# Patient Record
Sex: Female | Born: 1965 | Race: Black or African American | Hispanic: No | Marital: Single | Smoking: Never smoker
Health system: Southern US, Community
[De-identification: ages and names within clinical notes are randomized; demographics above are authoritative.]

## PROBLEM LIST (undated history)

## (undated) DIAGNOSIS — D219 Benign neoplasm of connective and other soft tissue, unspecified: Secondary | ICD-10-CM

## (undated) DIAGNOSIS — R002 Palpitations: Secondary | ICD-10-CM

## (undated) DIAGNOSIS — M549 Dorsalgia, unspecified: Secondary | ICD-10-CM

## (undated) DIAGNOSIS — S060XAA Concussion with loss of consciousness status unknown, initial encounter: Secondary | ICD-10-CM

## (undated) DIAGNOSIS — K921 Melena: Secondary | ICD-10-CM

## (undated) DIAGNOSIS — Z8619 Personal history of other infectious and parasitic diseases: Secondary | ICD-10-CM

## (undated) DIAGNOSIS — S060X9A Concussion with loss of consciousness of unspecified duration, initial encounter: Secondary | ICD-10-CM

## (undated) HISTORY — DX: Melena: K92.1

## (undated) HISTORY — DX: Dorsalgia, unspecified: M54.9

## (undated) HISTORY — DX: Personal history of other infectious and parasitic diseases: Z86.19

## (undated) HISTORY — DX: Palpitations: R00.2

## (undated) HISTORY — DX: Concussion with loss of consciousness of unspecified duration, initial encounter: S06.0X9A

## (undated) HISTORY — DX: Concussion with loss of consciousness status unknown, initial encounter: S06.0XAA

## (undated) HISTORY — DX: Benign neoplasm of connective and other soft tissue, unspecified: D21.9

---

## 2003-08-19 ENCOUNTER — Other Ambulatory Visit: Admission: RE | Admit: 2003-08-19 | Discharge: 2003-08-19 | Payer: Self-pay | Admitting: *Deleted

## 2007-02-02 ENCOUNTER — Emergency Department: Payer: Self-pay | Admitting: Emergency Medicine

## 2007-04-21 ENCOUNTER — Ambulatory Visit: Payer: Self-pay | Admitting: Internal Medicine

## 2008-02-04 ENCOUNTER — Ambulatory Visit: Payer: Self-pay

## 2008-06-23 ENCOUNTER — Emergency Department: Payer: Self-pay | Admitting: Emergency Medicine

## 2008-07-05 ENCOUNTER — Encounter: Payer: Self-pay | Admitting: Family Medicine

## 2008-07-08 ENCOUNTER — Encounter: Admission: RE | Admit: 2008-07-08 | Discharge: 2008-08-26 | Payer: Self-pay | Admitting: Family Medicine

## 2008-07-12 ENCOUNTER — Encounter: Payer: Self-pay | Admitting: Family Medicine

## 2008-07-28 ENCOUNTER — Encounter: Payer: Self-pay | Admitting: Family Medicine

## 2008-08-16 ENCOUNTER — Encounter: Payer: Self-pay | Admitting: Family Medicine

## 2009-02-15 ENCOUNTER — Ambulatory Visit: Payer: Self-pay | Admitting: Family

## 2009-02-15 ENCOUNTER — Encounter: Payer: Self-pay | Admitting: Family Medicine

## 2009-02-15 ENCOUNTER — Inpatient Hospital Stay (HOSPITAL_COMMUNITY): Admission: AD | Admit: 2009-02-15 | Discharge: 2009-02-15 | Payer: Self-pay | Admitting: Obstetrics and Gynecology

## 2009-03-24 ENCOUNTER — Encounter: Payer: Self-pay | Admitting: Family Medicine

## 2009-03-24 LAB — CONVERTED CEMR LAB: Pap Smear: NORMAL

## 2009-03-29 ENCOUNTER — Encounter: Payer: Self-pay | Admitting: Family Medicine

## 2009-03-29 ENCOUNTER — Other Ambulatory Visit: Admission: RE | Admit: 2009-03-29 | Discharge: 2009-03-29 | Payer: Self-pay | Admitting: Obstetrics and Gynecology

## 2009-07-18 ENCOUNTER — Encounter: Payer: Self-pay | Admitting: Family Medicine

## 2009-07-18 ENCOUNTER — Encounter: Admission: RE | Admit: 2009-07-18 | Discharge: 2009-07-18 | Payer: Self-pay | Admitting: Obstetrics and Gynecology

## 2009-09-28 ENCOUNTER — Emergency Department: Payer: Self-pay | Admitting: Unknown Physician Specialty

## 2009-12-13 ENCOUNTER — Encounter: Payer: Self-pay | Admitting: Family Medicine

## 2010-01-02 ENCOUNTER — Ambulatory Visit: Payer: Self-pay | Admitting: Family Medicine

## 2010-01-02 LAB — CONVERTED CEMR LAB
HDL: 68.2 mg/dL (ref >39.00)
Total CHOL/HDL Ratio: 3
Triglycerides: 41 mg/dL (ref 0.0–149.0)

## 2010-06-01 HISTORY — PX: UTERINE FIBROID SURGERY: SHX826

## 2010-07-08 ENCOUNTER — Other Ambulatory Visit: Payer: Self-pay | Admitting: Family Medicine

## 2010-07-08 DIAGNOSIS — Z0000 Encounter for general adult medical examination without abnormal findings: Secondary | ICD-10-CM

## 2010-07-09 ENCOUNTER — Encounter: Payer: Self-pay | Admitting: Obstetrics and Gynecology

## 2010-07-18 NOTE — Letter (Signed)
Summary: Deboraha Sprang @ [place] @ [employer]   Imported By: Lanelle Bal 01/23/2010 09:39:55  _____________________________________________________________________  External Attachment:    Type:   Image     Comment:   External Document

## 2010-07-18 NOTE — Assessment & Plan Note (Signed)
Summary: TRANSFER FROM [place]/[facility]   Vital Signs:  Patient profile:   45 year old female Height:      62.5 inches Weight:      162.75 pounds BMI:     29.40 Temp:     98.6 degrees F oral Pulse rate:   80 / minute Pulse rhythm:   regular BP sitting:   130 / 80  (left arm) Cuff size:   regular  Vitals Entered By: Delilah Shan CMA Tameyah Koch Dull) (January 02, 2010 8:55 AM) CC: Transfer from [place]   History of Present Illness: CPE- Due for exam.  Gyn care per Dr. Richardson Dopp.  See prev med in plan.  No acute c/o.  Prev patient at [facility].    Problems Prior to Update: 1)  Health Maintenance Exam  (ICD-V70.0) 2)  Family History of Ischemic Heart Disease  (ICD-V17.3)  Current Medications (verified): 1)  Multivitamins   Tabs (Multiple Vitamin) .... Take 1 Tablet By Mouth Once A Day  Allergies (verified): 1)  ! 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   [employer] grad and working on [employer] as of 2011.  No tobacco, no alcohol.  Exercising 2x/week.   Past Medical History: H/o chicken pox  Allergic rhinitis H/o blood in stool,  colonoscopy was done in 2010 (Dr. Evette Cristal)- no cancer, repeat in 2015 Fibroids per Dr. Richardson Dopp with gyn back pain resolved after physical therapy- 2010  Family History: F Alive HTN, lipids, CABG M Alive, healthy except for high chol all GPs dead, GM prev with DM2  Social History: 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  No tobacco, no alcohol.  Exercising 2x/week.   Review of Systems       See HPI.  Otherwise noncontributory.    Physical Exam  General:  GEN: nad, alert and oriented HEENT: mucous membranes moist, tm wnl, nasal epithelium wnl NECK: supple w/o LA CV: rrr.  no murmur PULM: ctab, no inc wob ABD:  soft, +bs EXT: no edema SKIN: no acute rash     Impression & Recommendations:  Problem # 1:  Preventive Health Care (ICD-V70.0) Due for colonscopy repeat 2015.  Gyn care per Dr. Richardson Dopp with pap up to date and mammogram ordered.  Td 2010, Flu shot encouraged.  glucose and lipids checked today.  Nonsmoker and exercise encouraged.    Problem # 2:  FAMILY HISTORY OF ISCHEMIC HEART DISEASE (ICD-V17.3) Contact with labs.   Orders: TLB-Lipid Panel ([id number]) TLB-Glucose, QUANT ([id number])  Complete Medication List: 1)  Multivitamins Tabs (Multiple vitamin) .... Take 1 tablet by mouth once a day  Other Orders: Radiology Referral (Radiology)  Patient Instructions: 1)  Please schedule a follow-up appointment in 1 year.  See Shirlee Limerick about your referral before your leave today.  We'll contact you with your lab report.  Take care.   Current Allergies (reviewed today): ! PENICILLIN   Preventive Care Screening  Last Tetanus Booster:    Date:  03/24/2009    Results:  Tdap   Pap Smear:    Date:  03/24/2009    Results:  normal   Colonoscopy:    Date:  12/22/2008    Results:  normal   Mammogram:    Date:  10/27/2008    Results:  normal

## 2010-07-18 NOTE — Letter (Signed)
Summary: Deboraha Sprang @ [facility] @ [employer]   Imported By: Lanelle Bal 01/23/2010 09:39:05  _____________________________________________________________________  External Attachment:    Type:   Image     Comment:   External Document

## 2010-07-18 NOTE — Letter (Signed)
Summary: Deboraha Sprang @ [facility] @ [employer]   Imported By: Lanelle Bal 01/23/2010 09:38:26  _____________________________________________________________________  External Attachment:    Type:   Image     Comment:   External Document

## 2010-07-18 NOTE — Letter (Signed)
Summary: [facility]  [facility]   Imported By: Lanelle Bal 01/23/2010 09:49:20  _____________________________________________________________________  External Attachment:    Type:   Image     Comment:   External Document

## 2010-07-18 NOTE — Letter (Signed)
Summary: [facility]  [facility]   Imported By: Lanelle Bal 01/23/2010 09:50:26  _____________________________________________________________________  External Attachment:    Type:   Image     Comment:   External Document

## 2010-07-18 NOTE — Miscellaneous (Signed)
Summary: PT Discharge/[facility]  PT Discharge/[facility]   Imported By: Lanelle Bal 01/23/2010 09:51:22  _____________________________________________________________________  External Attachment:    Type:   Image     Comment:   External Document

## 2010-07-18 NOTE — Procedures (Signed)
Summary: Colonoscopy/[facility]  Colonoscopy/[facility]   Imported By: Lanelle Bal 01/23/2010 09:42:59  _____________________________________________________________________  External Attachment:    Type:   Image     Comment:   External Document

## 2010-07-20 ENCOUNTER — Ambulatory Visit: Payer: Self-pay

## 2010-07-25 ENCOUNTER — Other Ambulatory Visit: Payer: Self-pay

## 2010-07-25 ENCOUNTER — Ambulatory Visit
Admission: RE | Admit: 2010-07-25 | Discharge: 2010-07-25 | Disposition: A | Discharge status: Home / Self Care | Payer: BC Managed Care – PPO | Source: Ambulatory Visit | Attending: Family Medicine | Admitting: Family Medicine

## 2010-07-25 DIAGNOSIS — Z0000 Encounter for general adult medical examination without abnormal findings: Secondary | ICD-10-CM

## 2010-08-08 ENCOUNTER — Ambulatory Visit (INDEPENDENT_AMBULATORY_CARE_PROVIDER_SITE_OTHER): Payer: BC Managed Care – PPO | Admitting: Family Medicine

## 2010-08-08 ENCOUNTER — Encounter: Payer: Self-pay | Admitting: Family Medicine

## 2010-08-08 DIAGNOSIS — R109 Unspecified abdominal pain: Secondary | ICD-10-CM

## 2010-08-09 ENCOUNTER — Telehealth: Payer: Self-pay | Admitting: Family Medicine

## 2010-08-15 NOTE — Assessment & Plan Note (Signed)
Summary: STOMACH PAIN,VOMITING,/[facility]   Vital Signs:  Patient profile:   45 year old female Weight:      162.50 pounds Temp:     98.9 degrees F oral Pulse rate:   96 / minute Pulse rhythm:   regular BP sitting:   118 / 72  (left arm) Cuff size:   large  Vitals Entered By: Selena Batten Dance CMA (AAMA) (August 08, 2010 9:09 AM) CC: Abd pain,vomitting,diarrhea   History of Present Illness: CC: abd pain/vomiting/diarrhea  this morning woke up at midnight and started having cramping mid abd pain, nausea, vomited.  Emesis NBNB, food.  Then started having diarrhea.  No blood in stool.  Cramping resolves after diarrhea but then comes back.  No fevers/chils, no new foods.  No sick contacts at home.  Works at [employer] and one child sick with diarrhea last week.  Appetite down.  drinking ginger ale.  Had fibroid surgery in december.  Restarted work January.    Current Medications (verified): 1)  Multivitamins   Tabs (Multiple Vitamin) .... Take 1 Tablet By Mouth Once A Day 2)  Provera 10 Mg Tabs (Medroxyprogesterone Acetate) .Marland Kitchen.. 1 By Mouth For 10 Days 3)  Estradiol 2 Mg Tabs (Estradiol) .Marland Kitchen.. 1 Vaginally Two Times A Day  Allergies: 1)  ! Penicillin  Past History:  Past Medical History: Last updated: 01/02/2010 H/o chicken pox  Allergic rhinitis H/o blood in stool,  colonoscopy was done in 2010 (Dr. Evette Cristal)- no cancer, repeat in 2015 Fibroids per Dr. Richardson Dopp with gyn back pain resolved after physical therapy- 2010  Past Surgical History: fibroid surgery 06/01/2010  Social History: Reviewed history from 01/02/2010 and no changes required. 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  No tobacco, no alcohol.  Exercising 2x/week.   Review of Systems       per HPI  Physical Exam  General:  Well-developed,well-nourished,in no acute distress; alert,appropriate and cooperative throughout examination Eyes:  PERRLA, EOMI, no injection Mouth:   MMM, no pharyngeal erythema Lungs:  Normal respiratory effort, chest expands symmetrically. Lungs are clear to auscultation, no crackles or wheezes. Heart:  Normal rate and regular rhythm. S1 and S2 normal without gallop, murmur, click, rub or other extra sounds. Abdomen:  Bowel sounds positive,abdomen soft and without masses, organomegaly or hernias noted.  tendeer to palpation periumbilically and epigastrically.  no rebound or guarding.  neg murphy sign. Pulses:  2+ rad pulses, brisk cap refill Extremities:  no pedal edema   Impression & Recommendations:  Problem # 1:  ABDOMINAL PAIN OTHER SPECIFIED SITE (ICD-789.09) Assessment New with n/v/d.  recent sick contact child with diarrhea at [employer] school, [place] virus going around.  Likely viral gastro. red flags to return including worsening abd pain, not improving as expected, fevers/chills, pain at RLQ.  None of these currently.  phenergan for nausea, push fluids  Complete Medication List: 1)  Multivitamins Tabs (Multiple vitamin) .... Take 1 tablet by mouth once a day 2)  Provera 10 Mg Tabs (Medroxyprogesterone acetate) .Marland Kitchen.. 1 by mouth for 10 days 3)  Estradiol 2 Mg Tabs (Estradiol) .Marland Kitchen.. 1 vaginally two times a day 4)  Promethazine Hcl 25 Mg Tabs (Promethazine hcl) .... Take one q6 hours as needed nausea  Patient Instructions: 1)  Sounds like stomach flu.   2)  Keep pushing fluids. 3)  Phenergan for nausea, watch out it can make you sleepy. 4)  Good to see you today.   5)  Update [place] if not feeling better as expected or any fevers/chills or worsening abdominal pain. Prescriptions: PROMETHAZINE HCL 25 MG TABS (PROMETHAZINE HCL) take one q6 hours as needed nausea  #30 x 0   Entered and Authorized by:   Eustaquio Boyden  MD   Signed by:   Eustaquio Boyden  MD on 08/08/2010   Method used:   Electronically to        [facility] [address] (retail)       [address]       [place], [place]   [address]       Ph: [phone]       Fax: [phone]   RxID:   [id number]    Orders Added: 1)  Est. Patient Level III [84132]    Current Allergies (reviewed today): ! PENICILLIN

## 2010-08-15 NOTE — Letter (Signed)
Summary: Out of Work  [facility] at [facility]  [address] [place], [place] [address]   Phone: [phone]  Fax: [phone]    August 08, 2010   Employee:  Marylene Land COLLINS-Eckley    To Whom It May Concern:   For Medical reasons, please excuse the above named employee from work for the following dates:  Start:  August 08, 2010   End:  August 09, 2010   If you need additional information, please feel free to contact our office.         Sincerely,    Eustaquio Boyden  MD

## 2010-08-15 NOTE — Progress Notes (Signed)
Summary: call a nurse   Phone Note Call from Patient   Summary of Call: Triage Record Num: [id number] Operator: Ethlyn Gallery Patient Name: Judy Anderson Call Date & Time: 08/08/2010 5:23:30PM Patient Phone: [phone] PCP: Patient Gender: Female PCP Fax : Patient DOB: Aug 28, 1965 Practice Name: [facility] Reason for Call: [name]/Pt called and stated she was seen in office today 08/08/10 and dx with stomach flu. She states she was told to call if he developed a fever. She states her temp is 100.2. she states she has been experiencing nausea and is having diarrhea. Emergent Sxs R/O Per Fever - adult Protocol. Homecare advice given. Protocol(s) Used: Fever - Adult Recommended Outcome per Protocol: Provide Home/Self Care Reason for Outcome: All other situations Care Advice: Normal body temperatures varies by person, age, activity, and time of day. It is an important part of the body's defense against infection.  ~ It takes 20-60 minutes for fever reducing meds to work. Take your temperature 1-2 hours after taking one of these medications to check if they are working.  ~  ~ HEALTH PROMOTION / MAINTENANCE  ~ SYMPTOM / CONDITION MANAGEMENT  ~ CAUTIONS COMFORT MEASURES FOR A FEVER: - Drink cool liquids or eat ice chips or popsicles. Avoid drinks with alcohol or caffeine. - Wear one layer of light-weight clothing. - Consider using a fan to improve circulation. - Rest until temperature returns to normal and other symptoms improve. - Use a lightweight blanket or other bedding. - A lukewarm (not cold) bath or shower can help lower body temperature. Cold water can cause shivering and raise temperature. If shivering starts, dry off and cover with lightweight clothing.  ~ Fever-reducing medications can lower body temperature but are often not necessary unless your temperature is over 101 F (38.3 C) in healthy adults or over 100 F (37.7 C) in frail elderly, immunocompromised  or pregnant individuals, or if you are uncomfortable. Take at least 2 doses as directed on label to see if this helps reduce your fever Initial call taken by: Melody Comas,  August 09, 2010 9:26 AM  Follow-up for Phone Call        call patient and get update.  She may have a fever episodically.  The main issue is not getting dehydrated.  Crawford Givens MD  August 09, 2010 1:20 PM   Spoke with patient via telephone and she stated that she is better, no fever, n/v/d.  She just ate some soup and was able to keep it down.  Drinking plenty of gatorade.  Advised her to keep hydrated and to call us if symptoms worsened or changed to let us know. Follow-up by: Linde Gillis CMA Geremy Rister Dull),  August 09, 2010 2:11 PM  Additional Follow-up for Phone Call Additional follow up Details #1::        noted.  thanks.  Crawford Givens MD  August 09, 2010 2:26 PM

## 2010-09-23 LAB — URINALYSIS, ROUTINE W REFLEX MICROSCOPIC
Bilirubin Urine: NEGATIVE
Ketones, ur: 15 mg/dL — AB
Leukocytes, UA: NEGATIVE
Nitrite: NEGATIVE
Protein, ur: NEGATIVE mg/dL
Urobilinogen, UA: 0.2 mg/dL (ref 0.0–1.0)
pH: 6 (ref 5.0–8.0)

## 2010-09-23 LAB — URINE CULTURE

## 2010-09-23 LAB — WET PREP, GENITAL
Clue Cells Wet Prep HPF POC: NONE SEEN
Trich, Wet Prep: NONE SEEN
Yeast Wet Prep HPF POC: NONE SEEN

## 2010-09-23 LAB — GC/CHLAMYDIA PROBE AMP, GENITAL
Chlamydia, DNA Probe: NEGATIVE
GC Probe Amp, Genital: NEGATIVE

## 2010-10-09 ENCOUNTER — Other Ambulatory Visit: Payer: Self-pay | Admitting: *Deleted

## 2010-10-09 DIAGNOSIS — Z0000 Encounter for general adult medical examination without abnormal findings: Secondary | ICD-10-CM

## 2011-01-04 ENCOUNTER — Encounter: Payer: Self-pay | Admitting: Family Medicine

## 2011-01-05 ENCOUNTER — Encounter: Payer: BC Managed Care – PPO | Admitting: Family Medicine

## 2011-01-23 ENCOUNTER — Encounter: Payer: BC Managed Care – PPO | Admitting: Family Medicine

## 2011-01-23 DIAGNOSIS — Z0289 Encounter for other administrative examinations: Secondary | ICD-10-CM

## 2011-02-19 ENCOUNTER — Other Ambulatory Visit: Payer: Self-pay | Admitting: Family Medicine

## 2011-02-19 DIAGNOSIS — Z8249 Family history of ischemic heart disease and other diseases of the circulatory system: Secondary | ICD-10-CM

## 2011-02-26 ENCOUNTER — Other Ambulatory Visit (INDEPENDENT_AMBULATORY_CARE_PROVIDER_SITE_OTHER): Payer: BC Managed Care – PPO

## 2011-02-26 DIAGNOSIS — Z8249 Family history of ischemic heart disease and other diseases of the circulatory system: Secondary | ICD-10-CM

## 2011-02-26 LAB — LIPID PANEL
Cholesterol: 161 mg/dL (ref 0–200)
LDL Cholesterol: 83 mg/dL (ref 0–99)
Total CHOL/HDL Ratio: 2
VLDL: 4.8 mg/dL (ref 0.0–40.0)

## 2011-03-05 ENCOUNTER — Encounter: Payer: BC Managed Care – PPO | Admitting: Family Medicine

## 2011-03-27 ENCOUNTER — Encounter: Payer: Self-pay | Admitting: Family Medicine

## 2011-03-27 ENCOUNTER — Ambulatory Visit (INDEPENDENT_AMBULATORY_CARE_PROVIDER_SITE_OTHER): Payer: BC Managed Care – PPO | Admitting: Family Medicine

## 2011-03-27 VITALS — BP 108/80 | HR 70 | Temp 99.0°F | Wt 162.1 lb

## 2011-03-27 DIAGNOSIS — Z1231 Encounter for screening mammogram for malignant neoplasm of breast: Secondary | ICD-10-CM

## 2011-03-27 DIAGNOSIS — J069 Acute upper respiratory infection, unspecified: Secondary | ICD-10-CM

## 2011-03-27 DIAGNOSIS — Z0000 Encounter for general adult medical examination without abnormal findings: Secondary | ICD-10-CM

## 2011-03-27 NOTE — Patient Instructions (Addendum)
Ask about the contact info for [facility]- Shirlee Limerick may be able to help you.   See Shirlee Limerick about your referral before your leave today. I would get a flu shot each fall.   Take care.  I would look into exercise videos that you can do at home.  Glad to see you.

## 2011-03-27 NOTE — Progress Notes (Signed)
CPE- See plan.  Routine anticipatory guidance given to patient.  See health maintenance.  We talked about exercise.  She had lost weight but wanted to work on toning up.  She had been jogging prev and felt better with this. We talked about resistance training/yoga today.  She'll have f/u with gyn clinic- she'll make the call about appointment.  FH CAD.  Labs d/w pt.  She felt a little queasy recently with dec in appetite, but it's some better today.  She had some rhinorrhea and some cough yesterday, both are recent and mild.  No fevers >100.4, 99 today.  No ear pain.    PMH and SH reviewed  Meds, vitals, and allergies reviewed.   ROS: See HPI.  Otherwise negative.    GEN: nad, alert and oriented HEENT: mucous membranes moist, slightly clear discharge on nasal exam NECK: supple w/o LA CV: rrr. PULM: ctab, no inc wob ABD: soft, +bs EXT: no edema SKIN: no acute rash

## 2011-03-29 NOTE — Assessment & Plan Note (Signed)
likley viral, benign exam and okay for outpatient f/u. She agrees.

## 2011-03-29 NOTE — Assessment & Plan Note (Signed)
Pap and gyn exam per gyn clinic, she'll call about scheduling this.  Labs and healthy habits d/w pt.

## 2011-04-10 ENCOUNTER — Encounter: Payer: BC Managed Care – PPO | Admitting: Family Medicine

## 2011-04-24 ENCOUNTER — Encounter: Payer: BC Managed Care – PPO | Admitting: Family Medicine

## 2011-04-27 ENCOUNTER — Encounter: Payer: Self-pay | Admitting: Family Medicine

## 2011-04-27 ENCOUNTER — Ambulatory Visit (INDEPENDENT_AMBULATORY_CARE_PROVIDER_SITE_OTHER): Payer: BC Managed Care – PPO | Admitting: Family Medicine

## 2011-04-27 DIAGNOSIS — J069 Acute upper respiratory infection, unspecified: Secondary | ICD-10-CM

## 2011-04-27 MED ORDER — ALBUTEROL SULFATE HFA 108 (90 BASE) MCG/ACT IN AERS: 1.0000 | INHALATION_SPRAY | RESPIRATORY_TRACT | Status: DC | PRN

## 2011-04-27 NOTE — Progress Notes (Signed)
Since last week, fever, cough, aches, and chest tightness.  She's had trouble getting warm; she just felt cold.  She felt some better and tried to go back to work Monday but then had more chest tightness, facial congestion and cough since then.  No h/o asthma.  Some wheeze noted by patient prev.    Meds, vitals, and allergies reviewed.   ROS: See HPI.  Otherwise, noncontributory.  GEN: nad, alert and oriented, pleasant in conversation HEENT: mucous membranes moist, tm w/o erythema, nasal exam w/ mild irritation, also with small adherent clot in the R nostril, clear discharge noted,  OP with cobblestoning, frontal and max sinuses aren't ttp NECK: supple w/o LA CV: rrr.   PULM: ctab, no inc wob, no wheeze, no dec in BS EXT: no edema SKIN: no acute rash

## 2011-04-27 NOTE — Patient Instructions (Signed)
Drink plenty of fluids, take tylenol as needed, and gargle with warm salt water for your throat.  This should gradually improve.  Take care.  Let us know if you have other concerns.  I would use the inhaler for the cough and chest tightness.  This should gradually get better.

## 2011-04-29 ENCOUNTER — Encounter: Payer: Self-pay | Admitting: Family Medicine

## 2011-04-29 NOTE — Assessment & Plan Note (Signed)
Her lungs are CTAB, she has not inc in wob, no sputum production, and no sinus tenderness.  She has diffuse aches but isn't toxic appearing.  This is likely viral and she is okay for outpatient f/u.  I would use SABA prn for cough that is likely viral associated.  I wouldn't start abx at this point. D/w pt and she understood.

## 2011-05-23 ENCOUNTER — Telehealth: Payer: Self-pay | Admitting: Internal Medicine

## 2011-05-23 NOTE — Telephone Encounter (Signed)
I believe the note would be for 05/22/11 and 05/23/11.  Please clarify with patient.  If correct, then send the letter.  Thanks.

## 2011-05-23 NOTE — Telephone Encounter (Signed)
Patient called and stated she was in on 11.9.12 with a viral infection and states she had a relapse and stayed out of work today and yesterday and followed your instructions that you had given her on the 9th.  She would like to have a note for work for 11.4.12 and 11.5.12 for being out.  Please advise.

## 2011-05-24 NOTE — Telephone Encounter (Signed)
LMOVM to return call.

## 2011-05-25 NOTE — Telephone Encounter (Signed)
Dates confirmed.  Note left at front desk for pick up.

## 2011-07-10 ENCOUNTER — Emergency Department: Payer: Self-pay | Admitting: Emergency Medicine

## 2011-07-25 ENCOUNTER — Encounter: Payer: Self-pay | Admitting: *Deleted

## 2011-07-25 ENCOUNTER — Ambulatory Visit (INDEPENDENT_AMBULATORY_CARE_PROVIDER_SITE_OTHER): Payer: BC Managed Care – PPO | Admitting: Family Medicine

## 2011-07-25 ENCOUNTER — Encounter: Payer: Self-pay | Admitting: Family Medicine

## 2011-07-25 VITALS — BP 138/84 | HR 66 | Temp 98.9°F | Wt 169.8 lb

## 2011-07-25 DIAGNOSIS — R000 Tachycardia, unspecified: Secondary | ICD-10-CM

## 2011-07-25 DIAGNOSIS — R002 Palpitations: Secondary | ICD-10-CM

## 2011-07-25 LAB — COMPREHENSIVE METABOLIC PANEL
AST: 20 U/L (ref 0–37)
Albumin: 3.8 g/dL (ref 3.5–5.2)
Alkaline Phosphatase: 61 U/L (ref 39–117)
Glucose, Bld: 99 mg/dL (ref 70–99)
Potassium: 4.7 mEq/L (ref 3.5–5.1)
Sodium: 138 mEq/L (ref 135–145)
Total Bilirubin: 0.3 mg/dL (ref 0.3–1.2)
Total Protein: 6.9 g/dL (ref 6.0–8.3)

## 2011-07-25 LAB — CBC WITH DIFFERENTIAL/PLATELET
Eosinophils Relative: 0.2 % (ref 0.0–5.0)
HCT: 38.6 % (ref 36.0–46.0)
Hemoglobin: 13.3 g/dL (ref 12.0–15.0)
Lymphs Abs: 1 10*3/uL (ref 0.7–4.0)
MCV: 96.1 fl (ref 78.0–100.0)
Monocytes Absolute: 0.4 10*3/uL (ref 0.1–1.0)
Monocytes Relative: 4.1 % (ref 3.0–12.0)
Neutro Abs: 7.4 10*3/uL (ref 1.4–7.7)
RDW: 12.6 % (ref 11.5–14.6)
WBC: 8.8 10*3/uL (ref 4.5–10.5)

## 2011-07-25 LAB — TSH: TSH: 0.45 u[IU]/mL (ref 0.35–5.50)

## 2011-07-25 NOTE — Patient Instructions (Signed)
Things are looking ok today. Blood work today. We will refer you to heart doctor to take a look. Please let us know if symptoms return or if worsening dizziness, shortness of breath or any chest pain.

## 2011-07-25 NOTE — Progress Notes (Signed)
Subjective:    Patient ID: Judy Anderson, female    DOB: [birth year], 46 y.o.   MRN: [id number]  HPI CC feeling ill  Judy Anderson is a pleasant 46 yo pt of Dr. Ashok Cordia new to me with h/o fibroids and back pain who presents with episode of tachycardia.  Woke up this morning with palpitation episode described as rapid heart beat that lasted an hour, also felt short of breath, nauseated, weak, and just "weird".  Denies skipped beats or irregular beats.  Denies chest pain or tightness, headaches, dizziness, presyncope.  Has never had anything like this before.  Not more stressed recently.  Nothing new at work recently, no stress at home.  Not recently using more albuterol.    Caffeine: drinks mainly water, last night had a 32 oz green tea.  Doesn't normally bother her.  No coffee.  Father with h/o CABG 20+ yrs ago.  No fmhx arrhythmias  Lab Results  Component Value Date   CHOL 161 02/26/2011   HDL 73.60 02/26/2011   LDLCALC 83 02/26/2011   TRIG 24.0 02/26/2011   CHOLHDL 2 02/26/2011   BP Readings from Last 3 Encounters:  07/25/11 138/84  04/27/11 130/90  03/27/11 108/80    Medications and allergies reviewed and updated in chart.  Past histories reviewed and updated if relevant as below. Patient Active Problem List  Diagnoses  . Routine general medical examination at a health care facility  . URI (upper respiratory infection)   Past Medical History  Diagnosis Date  . History of chicken pox   . Allergic rhinitis   . Blood in stool     hx of, colonoscopy in 2010, no cancer, repeat in 2015- Dr. Evette Cristal  . Fibroids     per gyn clinic   . Back pain     resolved with physical therapy-2010   Past Surgical History  Procedure Date  . Uterine fibroid surgery 06/01/10   History  Substance Use Topics  . Smoking status: Never Smoker   . Smokeless tobacco: Not on file  . Alcohol Use: No   Family History  Problem Relation Age of Onset  . Hyperlipidemia Mother   . Hypertension  Father   . Hyperlipidemia Father   . Heart disease Father     CABG   Allergies  Allergen Reactions  . Penicillins     REACTION: Throat swells   Current Outpatient Prescriptions on File Prior to Visit  Medication Sig Dispense Refill  . Multiple Vitamin (MULTIVITAMIN) tablet Take 1 tablet by mouth daily.        Marland Kitchen albuterol (PROVENTIL HFA;VENTOLIN HFA) 108 (90 BASE) MCG/ACT inhaler Inhale 1-2 puffs into the lungs every 4 (four) hours as needed for wheezing (or for cough).  18 g  1    Review of Systems See HPI    Objective:   Physical Exam  Nursing note and vitals reviewed. Constitutional: She appears well-developed and well-nourished. No distress.  HENT:  Head: Normocephalic and atraumatic.  Mouth/Throat: Oropharynx is clear and moist. No oropharyngeal exudate.  Eyes: Conjunctivae and EOM are normal. Pupils are equal, round, and reactive to light. No scleral icterus.  Neck: Normal range of motion. Neck supple. Carotid bruit is not present. No thyromegaly present.  Cardiovascular: Normal rate, regular rhythm, normal heart sounds and intact distal pulses.   No murmur heard. Pulmonary/Chest: Effort normal and breath sounds normal. No respiratory distress. She has no wheezes. She has no rales.  Musculoskeletal: She exhibits no edema.  Lymphadenopathy:  She has no cervical adenopathy.  Skin: Skin is warm and dry. No rash noted.       Assessment & Plan:

## 2011-07-25 NOTE — Assessment & Plan Note (Signed)
Sounds like tachcyardia, ?SVT. Exam and EKG normal today. Check TSH, CBC today. No significant caffeine use. Will refer to cards for further eval.  EKG - sinus arrhythmia 73, normal axis, intervals, no hypertrophy or ST/T changes.  doubt P mitrale.  Occasional PAC

## 2011-07-27 ENCOUNTER — Ambulatory Visit: Payer: BC Managed Care – PPO

## 2011-08-14 ENCOUNTER — Encounter: Payer: Self-pay | Admitting: *Deleted

## 2011-08-15 ENCOUNTER — Ambulatory Visit (INDEPENDENT_AMBULATORY_CARE_PROVIDER_SITE_OTHER): Payer: BC Managed Care – PPO | Admitting: Cardiovascular Disease

## 2011-08-15 ENCOUNTER — Encounter: Payer: Self-pay | Admitting: Cardiovascular Disease

## 2011-08-15 VITALS — BP 120/72 | HR 66 | Ht 62.5 in | Wt 173.0 lb

## 2011-08-15 DIAGNOSIS — R000 Tachycardia, unspecified: Secondary | ICD-10-CM | POA: Insufficient documentation

## 2011-08-15 DIAGNOSIS — R002 Palpitations: Secondary | ICD-10-CM

## 2011-08-15 MED ORDER — DILTIAZEM HCL 30 MG PO TABS: 30.0000 mg | ORAL_TABLET | Freq: Three times a day (TID) | ORAL | Status: DC | PRN

## 2011-08-15 NOTE — Assessment & Plan Note (Signed)
Etiology of her tachycardia is concerning for SVT, atrial tachycardia or other arrhythmia. We have talked her the carotid sinus massage and Valsalva if her symptoms recur. We have suggested she try to obtain an EKG and a local EMT substation when she is having symptoms. If symptoms come more frequent, we could order a 30 day event monitor.  We have given her a prescription for diltiazem 30 mg to take when necessary 1 tab or 2 tabs as needed for tachycardia. We did discuss ablation if her symptoms recur and we are able to document SVT. She would like to monitor her symptoms for now as she is relatively asymptomatic and symptoms are very rare.  No further workup done at this time given the essentially benign physical exam and EKG.
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
## 2011-08-15 NOTE — Patient Instructions (Signed)
You are doing well. Take the diltiazem one or two as needed for tachycardia Call the office if you have more symptoms  Please call us if you have new issues that need to be addressed before your next appt.  Your physician wants you to follow-up in: 12 months.  You will receive a reminder letter in the mail two months in advance. If you don't receive a letter, please call our office to schedule the follow-up appointment.

## 2011-08-28 ENCOUNTER — Ambulatory Visit (INDEPENDENT_AMBULATORY_CARE_PROVIDER_SITE_OTHER): Payer: BC Managed Care – PPO | Admitting: Family Medicine

## 2011-08-28 ENCOUNTER — Encounter: Payer: Self-pay | Admitting: Family Medicine

## 2011-08-28 VITALS — BP 118/73 | HR 62 | Ht 62.0 in | Wt 169.0 lb

## 2011-08-28 DIAGNOSIS — Z124 Encounter for screening for malignant neoplasm of cervix: Secondary | ICD-10-CM

## 2011-08-28 DIAGNOSIS — Z1231 Encounter for screening mammogram for malignant neoplasm of breast: Secondary | ICD-10-CM

## 2011-08-28 NOTE — Patient Instructions (Signed)
Preventive Care for Adults, Female A healthy lifestyle and preventive care can promote health and wellness. Preventive health guidelines for women include the following key practices.  A routine yearly physical is a good way to check with your caregiver about your health and preventive screening. It is a chance to share any concerns and updates on your health, and to receive a thorough exam.   Visit your dentist for a routine exam and preventive care every 6 months. Brush your teeth twice a day and floss once a day. Good oral hygiene prevents tooth decay and gum disease.   The frequency of eye exams is based on your age, health, family medical history, use of contact lenses, and other factors. Follow your caregiver's recommendations for frequency of eye exams.   Eat a healthy diet. Foods like vegetables, fruits, whole grains, low-fat dairy products, and lean protein foods contain the nutrients you need without too many calories. Decrease your intake of foods high in solid fats, added sugars, and salt. Eat the right amount of calories for you.Get information about a proper diet from your caregiver, if necessary.   Regular physical exercise is one of the most important things you can do for your health. Most adults should get at least 150 minutes of moderate-intensity exercise (any activity that increases your heart rate and causes you to sweat) each week. In addition, most adults need muscle-strengthening exercises on 2 or more days a week.   Maintain a healthy weight. The body mass index (BMI) is a screening tool to identify possible weight problems. It provides an estimate of body fat based on height and weight. Your caregiver can help determine your BMI, and can help you achieve or maintain a healthy weight.For adults 20 years and older:   A BMI below 18.5 is considered underweight.   A BMI of 18.5 to 24.9 is normal.   A BMI of 25 to 29.9 is considered overweight.   A BMI of 30 and above is  considered obese.   Maintain normal blood lipids and cholesterol levels by exercising and minimizing your intake of saturated fat. Eat a balanced diet with plenty of fruit and vegetables. Blood tests for lipids and cholesterol should begin at age 20 and be repeated every 5 years. If your lipid or cholesterol levels are high, you are over 50, or you are at high risk for heart disease, you may need your cholesterol levels checked more frequently.Ongoing high lipid and cholesterol levels should be treated with medicines if diet and exercise are not effective.   If you smoke, find out from your caregiver how to quit. If you do not use tobacco, do not start.   If you are pregnant, do not drink alcohol. If you are breastfeeding, be very cautious about drinking alcohol. If you are not pregnant and choose to drink alcohol, do not exceed 1 drink per day. One drink is considered to be 12 ounces (355 mL) of beer, 5 ounces (148 mL) of wine, or 1.5 ounces (44 mL) of liquor.   Avoid use of street drugs. Do not share needles with anyone. Ask for help if you need support or instructions about stopping the use of drugs.   High blood pressure causes heart disease and increases the risk of stroke. Your blood pressure should be checked at least every 1 to 2 years. Ongoing high blood pressure should be treated with medicines if weight loss and exercise are not effective.   If you are 55 to 47   years old, ask your caregiver if you should take aspirin to prevent strokes.   Diabetes screening involves taking a blood sample to check your fasting blood sugar level. This should be done once every 3 years, after age 45, if you are within normal weight and without risk factors for diabetes. Testing should be considered at a younger age or be carried out more frequently if you are overweight and have at least 1 risk factor for diabetes.   Breast cancer screening is essential preventive care for women. You should practice "breast  self-awareness." This means understanding the normal appearance and feel of your breasts and may include breast self-examination. Any changes detected, no matter how small, should be reported to a caregiver. Women in their 20s and 30s should have a clinical breast exam (CBE) by a caregiver as part of a regular health exam every 1 to 3 years. After age 40, women should have a CBE every year. Starting at age 40, women should consider having a mammography (breast X-ray test) every year. Women who have a family history of breast cancer should talk to their caregiver about genetic screening. Women at a high risk of breast cancer should talk to their caregivers about having magnetic resonance imaging (MRI) and a mammography every year.   The Pap test is a screening test for cervical cancer. A Pap test can show cell changes on the cervix that might become cervical cancer if left untreated. A Pap test is a procedure in which cells are obtained and examined from the lower end of the uterus (cervix).   Women should have a Pap test starting at age 21.   Between ages 21 and 29, Pap tests should be repeated every 2 years.   Beginning at age 30, you should have a Pap test every 3 years as long as the past 3 Pap tests have been normal.   Some women have medical problems that increase the chance of getting cervical cancer. Talk to your caregiver about these problems. It is especially important to talk to your caregiver if a new problem develops soon after your last Pap test. In these cases, your caregiver may recommend more frequent screening and Pap tests.   The above recommendations are the same for women who have or have not gotten the vaccine for human papillomavirus (HPV).   If you had a hysterectomy for a problem that was not cancer or a condition that could lead to cancer, then you no longer need Pap tests. Even if you no longer need a Pap test, a regular exam is a good idea to make sure no other problems are  starting.   If you are between ages 65 and 70, and you have had normal Pap tests going back 10 years, you no longer need Pap tests. Even if you no longer need a Pap test, a regular exam is a good idea to make sure no other problems are starting.   If you have had past treatment for cervical cancer or a condition that could lead to cancer, you need Pap tests and screening for cancer for at least 20 years after your treatment.   If Pap tests have been discontinued, risk factors (such as a new sexual partner) need to be reassessed to determine if screening should be resumed.   The HPV test is an additional test that may be used for cervical cancer screening. The HPV test looks for the virus that can cause the cell changes on the cervix.   The cells collected during the Pap test can be tested for HPV. The HPV test could be used to screen women aged 30 years and older, and should be used in women of any age who have unclear Pap test results. After the age of 30, women should have HPV testing at the same frequency as a Pap test.   Colorectal cancer can be detected and often prevented. Most routine colorectal cancer screening begins at the age of 50 and continues through age 75. However, your caregiver may recommend screening at an earlier age if you have risk factors for colon cancer. On a yearly basis, your caregiver may provide home test kits to check for hidden blood in the stool. Use of a small camera at the end of a tube, to directly examine the colon (sigmoidoscopy or colonoscopy), can detect the earliest forms of colorectal cancer. Talk to your caregiver about this at age 50, when routine screening begins. Direct examination of the colon should be repeated every 5 to 10 years through age 75, unless early forms of pre-cancerous polyps or small growths are found.   Hepatitis C blood testing is recommended for all people born from 1945 through 1965 and any individual with known risks for hepatitis C.    Practice safe sex. Use condoms and avoid high-risk sexual practices to reduce the spread of sexually transmitted infections (STIs). STIs include gonorrhea, chlamydia, syphilis, trichomonas, herpes, HPV, and human immunodeficiency virus (HIV). Herpes, HIV, and HPV are viral illnesses that have no cure. They can result in disability, cancer, and death. Sexually active women aged 25 and younger should be checked for chlamydia. Older women with new or multiple partners should also be tested for chlamydia. Testing for other STIs is recommended if you are sexually active and at increased risk.   Osteoporosis is a disease in which the bones lose minerals and strength with aging. This can result in serious bone fractures. The risk of osteoporosis can be identified using a bone density scan. Women ages 65 and over and women at risk for fractures or osteoporosis should discuss screening with their caregivers. Ask your caregiver whether you should take a calcium supplement or vitamin D to reduce the rate of osteoporosis.   Menopause can be associated with physical symptoms and risks. Hormone replacement therapy is available to decrease symptoms and risks. You should talk to your caregiver about whether hormone replacement therapy is right for you.   Use sunscreen with sun protection factor (SPF) of 30 or more. Apply sunscreen liberally and repeatedly throughout the day. You should seek shade when your shadow is shorter than you. Protect yourself by wearing long sleeves, pants, a wide-brimmed hat, and sunglasses year round, whenever you are outdoors.   Once a month, do a whole body skin exam, using a mirror to look at the skin on your back. Notify your caregiver of new moles, moles that have irregular borders, moles that are larger than a pencil eraser, or moles that have changed in shape or color.   Stay current with required immunizations.   Influenza. You need a dose every fall (or winter). The composition of  the flu vaccine changes each year, so being vaccinated once is not enough.   Pneumococcal polysaccharide. You need 1 to 2 doses if you smoke cigarettes or if you have certain chronic medical conditions. You need 1 dose at age 65 (or older) if you have never been vaccinated.   Tetanus, diphtheria, pertussis (Tdap, Td). Get 1 dose of   Tdap vaccine if you are younger than age 65, are over 65 and have contact with an infant, are a healthcare worker, are pregnant, or simply want to be protected from whooping cough. After that, you need a Td booster dose every 10 years. Consult your caregiver if you have not had at least 3 tetanus and diphtheria-containing shots sometime in your life or have a deep or dirty wound.   HPV. You need this vaccine if you are a woman age 26 or younger. The vaccine is given in 3 doses over 6 months.   Measles, mumps, rubella (MMR). You need at least 1 dose of MMR if you were born in 1957 or later. You may also need a second dose.   Meningococcal. If you are age 19 to 21 and a first-year college student living in a residence hall, or have one of several medical conditions, you need to get vaccinated against meningococcal disease. You may also need additional booster doses.   Zoster (shingles). If you are age 60 or older, you should get this vaccine.   Varicella (chickenpox). If you have never had chickenpox or you were vaccinated but received only 1 dose, talk to your caregiver to find out if you need this vaccine.   Hepatitis A. You need this vaccine if you have a specific risk factor for hepatitis A virus infection or you simply wish to be protected from this disease. The vaccine is usually given as 2 doses, 6 to 18 months apart.   Hepatitis B. You need this vaccine if you have a specific risk factor for hepatitis B virus infection or you simply wish to be protected from this disease. The vaccine is given in 3 doses, usually over 6 months.  Preventive Services /  Frequency Ages 19 to 39  Blood pressure check.** / Every 1 to 2 years.   Lipid and cholesterol check.** / Every 5 years beginning at age 20.   Clinical breast exam.** / Every 3 years for women in their 20s and 30s.   Pap test.** / Every 2 years from ages 21 through 29. Every 3 years starting at age 30 through age 65 or 70 with a history of 3 consecutive normal Pap tests.   HPV screening.** / Every 3 years from ages 30 through ages 65 to 70 with a history of 3 consecutive normal Pap tests.   Hepatitis C blood test.** / For any individual with known risks for hepatitis C.   Skin self-exam. / Monthly.   Influenza immunization.** / Every year.   Pneumococcal polysaccharide immunization.** / 1 to 2 doses if you smoke cigarettes or if you have certain chronic medical conditions.   Tetanus, diphtheria, pertussis (Tdap, Td) immunization. / A one-time dose of Tdap vaccine. After that, you need a Td booster dose every 10 years.   HPV immunization. / 3 doses over 6 months, if you are 26 and younger.   Measles, mumps, rubella (MMR) immunization. / You need at least 1 dose of MMR if you were born in 1957 or later. You may also need a second dose.   Meningococcal immunization. / 1 dose if you are age 19 to 21 and a first-year college student living in a residence hall, or have one of several medical conditions, you need to get vaccinated against meningococcal disease. You may also need additional booster doses.   Varicella immunization.** / Consult your caregiver.   Hepatitis A immunization.** / Consult your caregiver. 2 doses, 6 to 18 months   apart.   Hepatitis B immunization.** / Consult your caregiver. 3 doses usually over 6 months.  Ages 40 to 64  Blood pressure check.** / Every 1 to 2 years.   Lipid and cholesterol check.** / Every 5 years beginning at age 20.   Clinical breast exam.** / Every year after age 40.   Mammogram.** / Every year beginning at age 40 and continuing for as  long as you are in good health. Consult with your caregiver.   Pap test.** / Every 3 years starting at age 30 through age 65 or 70 with a history of 3 consecutive normal Pap tests.   HPV screening.** / Every 3 years from ages 30 through ages 65 to 70 with a history of 3 consecutive normal Pap tests.   Fecal occult blood test (FOBT) of stool. / Every year beginning at age 50 and continuing until age 75. You may not need to do this test if you get a colonoscopy every 10 years.   Flexible sigmoidoscopy or colonoscopy.** / Every 5 years for a flexible sigmoidoscopy or every 10 years for a colonoscopy beginning at age 50 and continuing until age 75.   Hepatitis C blood test.** / For all people born from 1945 through 1965 and any individual with known risks for hepatitis C.   Skin self-exam. / Monthly.   Influenza immunization.** / Every year.   Pneumococcal polysaccharide immunization.** / 1 to 2 doses if you smoke cigarettes or if you have certain chronic medical conditions.   Tetanus, diphtheria, pertussis (Tdap, Td) immunization.** / A one-time dose of Tdap vaccine. After that, you need a Td booster dose every 10 years.   Measles, mumps, rubella (MMR) immunization. / You need at least 1 dose of MMR if you were born in 1957 or later. You may also need a second dose.   Varicella immunization.** / Consult your caregiver.   Meningococcal immunization.** / Consult your caregiver.   Hepatitis A immunization.** / Consult your caregiver. 2 doses, 6 to 18 months apart.   Hepatitis B immunization.** / Consult your caregiver. 3 doses, usually over 6 months.  Ages 65 and over  Blood pressure check.** / Every 1 to 2 years.   Lipid and cholesterol check.** / Every 5 years beginning at age 20.   Clinical breast exam.** / Every year after age 40.   Mammogram.** / Every year beginning at age 40 and continuing for as long as you are in good health. Consult with your caregiver.   Pap test.** /  Every 3 years starting at age 30 through age 65 or 70 with a 3 consecutive normal Pap tests. Testing can be stopped between 65 and 70 with 3 consecutive normal Pap tests and no abnormal Pap or HPV tests in the past 10 years.   HPV screening.** / Every 3 years from ages 30 through ages 65 or 70 with a history of 3 consecutive normal Pap tests. Testing can be stopped between 65 and 70 with 3 consecutive normal Pap tests and no abnormal Pap or HPV tests in the past 10 years.   Fecal occult blood test (FOBT) of stool. / Every year beginning at age 50 and continuing until age 75. You may not need to do this test if you get a colonoscopy every 10 years.   Flexible sigmoidoscopy or colonoscopy.** / Every 5 years for a flexible sigmoidoscopy or every 10 years for a colonoscopy beginning at age 50 and continuing until age 75.   Hepatitis   C blood test.** / For all people born from 1945 through 1965 and any individual with known risks for hepatitis C.   Osteoporosis screening.** / A one-time screening for women ages 65 and over and women at risk for fractures or osteoporosis.   Skin self-exam. / Monthly.   Influenza immunization.** / Every year.   Pneumococcal polysaccharide immunization.** / 1 dose at age 65 (or older) if you have never been vaccinated.   Tetanus, diphtheria, pertussis (Tdap, Td) immunization. / A one-time dose of Tdap vaccine if you are over 65 and have contact with an infant, are a healthcare worker, or simply want to be protected from whooping cough. After that, you need a Td booster dose every 10 years.   Varicella immunization.** / Consult your caregiver.   Meningococcal immunization.** / Consult your caregiver.   Hepatitis A immunization.** / Consult your caregiver. 2 doses, 6 to 18 months apart.   Hepatitis B immunization.** / Check with your caregiver. 3 doses, usually over 6 months.  ** Family history and personal history of risk and conditions may change your caregiver's  recommendations. Document Released: 07/31/2001 Document Revised: 05/24/2011 Document Reviewed: 10/30/2010 Exit[employer] Patient Information 2012 [employer]. 

## 2011-08-28 NOTE — Progress Notes (Signed)
  Subjective:     Judy Anderson is a 46 y.o. female and is here for a comprehensive physical exam. The patient reports normal cycles following myomectomy.  History   Social History  . Marital Status: Legally Separated    Spouse Name: N/A    Number of Children: N/A  . Years of Education: N/A   Occupational History  . teacher's assistant at [employer]    Social History Main Topics  . Smoking status: Never Smoker   . Smokeless tobacco: Not on file  . Alcohol Use: No  . Drug Use: No  . Sexually Active: No   Other Topics Concern  . 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 Pap Smear  11/26/1982  . Influenza Vaccine  03/19/2011  . Tetanus/tdap  03/25/2019    The following portions of the patient's history were reviewed and updated as appropriate: allergies, current medications, past family history, past medical history, past social history, past surgical history and problem list.  Review of Systems A comprehensive review of systems was negative.   Objective:    BP 118/73  Pulse 62  Ht 5\' 2"  (1.575 m)  Wt 169 lb (76.658 kg)  BMI 30.91 kg/m2  LMP 08/25/2011 General appearance: alert, cooperative and appears stated age Head: Normocephalic, without obvious abnormality, atraumatic Neck: no adenopathy, supple, symmetrical, trachea midline and thyroid not enlarged, symmetric, no tenderness/mass/nodules Lungs: clear to auscultation bilaterally Breasts: normal appearance, no masses or tenderness Heart: regular rate and rhythm, S1, S2 normal, no murmur, click, rub or gallop Abdomen: soft, non-tender; bowel sounds normal; no masses,  no organomegaly Pelvic: cervix normal in appearance, external genitalia normal, no adnexal masses or tenderness, no cervical motion tenderness, uterus normal size, shape, and  consistency and vagina normal without discharge Extremities: extremities normal, atraumatic, no cyanosis or edema Pulses: 2+ and symmetric Skin: Skin color, texture, turgor normal. No rashes or lesions Lymph nodes: Cervical, supraclavicular, and axillary nodes normal. Neurologic: Grossly normal    Assessment:    Healthy female exam.      Plan:  Pap smear Mammogram   See After Visit Summary for Counseling Recommendations

## 2011-09-21 ENCOUNTER — Ambulatory Visit (HOSPITAL_COMMUNITY)
Admission: RE | Admit: 2011-09-21 | Discharge: 2011-09-21 | Disposition: A | Discharge status: Home / Self Care | Payer: BC Managed Care – PPO | Source: Ambulatory Visit | Attending: Family Medicine | Admitting: Family Medicine

## 2011-09-21 DIAGNOSIS — Z1231 Encounter for screening mammogram for malignant neoplasm of breast: Secondary | ICD-10-CM | POA: Insufficient documentation

## 2012-09-25 ENCOUNTER — Encounter: Payer: Self-pay | Admitting: *Deleted

## 2012-09-25 ENCOUNTER — Ambulatory Visit: Payer: BC Managed Care – PPO | Admitting: Cardiovascular Disease

## 2013-01-07 ENCOUNTER — Other Ambulatory Visit: Payer: Self-pay | Admitting: Family Medicine

## 2013-01-07 DIAGNOSIS — Z8249 Family history of ischemic heart disease and other diseases of the circulatory system: Secondary | ICD-10-CM

## 2013-01-19 ENCOUNTER — Other Ambulatory Visit (INDEPENDENT_AMBULATORY_CARE_PROVIDER_SITE_OTHER): Payer: BC Managed Care – HMO

## 2013-01-19 DIAGNOSIS — R002 Palpitations: Secondary | ICD-10-CM

## 2013-01-19 DIAGNOSIS — R000 Tachycardia, unspecified: Secondary | ICD-10-CM

## 2013-01-19 DIAGNOSIS — Z8249 Family history of ischemic heart disease and other diseases of the circulatory system: Secondary | ICD-10-CM

## 2013-01-19 LAB — BASIC METABOLIC PANEL
Chloride: 108 mEq/L (ref 96–112)
GFR: 83.74 mL/min (ref >60.00)
Potassium: 4.9 mEq/L (ref 3.5–5.1)
Sodium: 138 mEq/L (ref 135–145)

## 2013-01-19 LAB — LIPID PANEL
LDL Cholesterol: 88 mg/dL (ref 0–99)
VLDL: 9.6 mg/dL (ref 0.0–40.0)

## 2013-01-26 ENCOUNTER — Encounter: Payer: Self-pay | Admitting: Family Medicine

## 2013-01-26 ENCOUNTER — Ambulatory Visit (INDEPENDENT_AMBULATORY_CARE_PROVIDER_SITE_OTHER): Payer: BC Managed Care – HMO | Admitting: Family Medicine

## 2013-01-26 ENCOUNTER — Telehealth: Payer: Self-pay | Admitting: *Deleted

## 2013-01-26 VITALS — BP 120/84 | HR 80 | Temp 98.5°F | Ht 61.5 in | Wt 185.5 lb

## 2013-01-26 VITALS — BP 114/74 | HR 64 | Ht 62.0 in | Wt 187.0 lb

## 2013-01-26 DIAGNOSIS — Z01419 Encounter for gynecological examination (general) (routine) without abnormal findings: Secondary | ICD-10-CM

## 2013-01-26 DIAGNOSIS — Z1151 Encounter for screening for human papillomavirus (HPV): Secondary | ICD-10-CM

## 2013-01-26 DIAGNOSIS — Z0000 Encounter for general adult medical examination without abnormal findings: Secondary | ICD-10-CM

## 2013-01-26 DIAGNOSIS — Z124 Encounter for screening for malignant neoplasm of cervix: Secondary | ICD-10-CM

## 2013-01-26 DIAGNOSIS — Z1239 Encounter for other screening for malignant neoplasm of breast: Secondary | ICD-10-CM

## 2013-01-26 DIAGNOSIS — R002 Palpitations: Secondary | ICD-10-CM

## 2013-01-26 MED ORDER — DILTIAZEM HCL 30 MG PO TABS: 30.0000 mg | ORAL_TABLET | Freq: Three times a day (TID) | ORAL | Status: DC | PRN

## 2013-01-26 NOTE — Patient Instructions (Addendum)
Preventive Care for Adults, Female A healthy lifestyle and preventive care can promote health and wellness. Preventive health guidelines for women include the following key practices.  A routine yearly physical is a good way to check with your caregiver about your health and preventive screening. It is a chance to share any concerns and updates on your health, and to receive a thorough exam.  Visit your dentist for a routine exam and preventive care every 6 months. Brush your teeth twice a day and floss once a day. Good oral hygiene prevents tooth decay and gum disease.  The frequency of eye exams is based on your age, health, family medical history, use of contact lenses, and other factors. Follow your caregiver's recommendations for frequency of eye exams.  Eat a healthy diet. Foods like vegetables, fruits, whole grains, low-fat dairy products, and lean protein foods contain the nutrients you need without too many calories. Decrease your intake of foods high in solid fats, added sugars, and salt. Eat the right amount of calories for you.Get information about a proper diet from your caregiver, if necessary.  Regular physical exercise is one of the most important things you can do for your health. Most adults should get at least 150 minutes of moderate-intensity exercise (any activity that increases your heart rate and causes you to sweat) each week. In addition, most adults need muscle-strengthening exercises on 2 or more days a week.  Maintain a healthy weight. The body mass index (BMI) is a screening tool to identify possible weight problems. It provides an estimate of body fat based on height and weight. Your caregiver can help determine your BMI, and can help you achieve or maintain a healthy weight.For adults 20 years and older:  A BMI below 18.5 is considered underweight.  A BMI of 18.5 to 24.9 is normal.  A BMI of 25 to 29.9 is considered overweight.  A BMI of 30 and above is  considered obese.  Maintain normal blood lipids and cholesterol levels by exercising and minimizing your intake of saturated fat. Eat a balanced diet with plenty of fruit and vegetables. Blood tests for lipids and cholesterol should begin at age 20 and be repeated every 5 years. If your lipid or cholesterol levels are high, you are over 50, or you are at high risk for heart disease, you may need your cholesterol levels checked more frequently.Ongoing high lipid and cholesterol levels should be treated with medicines if diet and exercise are not effective.  If you smoke, find out from your caregiver how to quit. If you do not use tobacco, do not start.  If you are pregnant, do not drink alcohol. If you are breastfeeding, be very cautious about drinking alcohol. If you are not pregnant and choose to drink alcohol, do not exceed 1 drink per day. One drink is considered to be 12 ounces (355 mL) of beer, 5 ounces (148 mL) of wine, or 1.5 ounces (44 mL) of liquor.  Avoid use of street drugs. Do not share needles with anyone. Ask for help if you need support or instructions about stopping the use of drugs.  High blood pressure causes heart disease and increases the risk of stroke. Your blood pressure should be checked at least every 1 to 2 years. Ongoing high blood pressure should be treated with medicines if weight loss and exercise are not effective.  If you are 55 to 48 years old, ask your caregiver if you should take aspirin to prevent strokes.  Diabetes   screening involves taking a blood sample to check your fasting blood sugar level. This should be done once every 3 years, after age 45, if you are within normal weight and without risk factors for diabetes. Testing should be considered at a younger age or be carried out more frequently if you are overweight and have at least 1 risk factor for diabetes.  Breast cancer screening is essential preventive care for women. You should practice "breast  self-awareness." This means understanding the normal appearance and feel of your breasts and may include breast self-examination. Any changes detected, no matter how small, should be reported to a caregiver. Women in their 20s and 30s should have a clinical breast exam (CBE) by a caregiver as part of a regular health exam every 1 to 3 years. After age 40, women should have a CBE every year. Starting at age 40, women should consider having a mammography (breast X-ray test) every year. Women who have a family history of breast cancer should talk to their caregiver about genetic screening. Women at a high risk of breast cancer should talk to their caregivers about having magnetic resonance imaging (MRI) and a mammography every year.  The Pap test is a screening test for cervical cancer. A Pap test can show cell changes on the cervix that might become cervical cancer if left untreated. A Pap test is a procedure in which cells are obtained and examined from the lower end of the uterus (cervix).  Women should have a Pap test starting at age 21.  Between ages 21 and 29, Pap tests should be repeated every 2 years.  Beginning at age 30, you should have a Pap test every 3 years as long as the past 3 Pap tests have been normal.  Some women have medical problems that increase the chance of getting cervical cancer. Talk to your caregiver about these problems. It is especially important to talk to your caregiver if a new problem develops soon after your last Pap test. In these cases, your caregiver may recommend more frequent screening and Pap tests.  The above recommendations are the same for women who have or have not gotten the vaccine for human papillomavirus (HPV).  If you had a hysterectomy for a problem that was not cancer or a condition that could lead to cancer, then you no longer need Pap tests. Even if you no longer need a Pap test, a regular exam is a good idea to make sure no other problems are  starting.  If you are between ages 65 and 70, and you have had normal Pap tests going back 10 years, you no longer need Pap tests. Even if you no longer need a Pap test, a regular exam is a good idea to make sure no other problems are starting.  If you have had past treatment for cervical cancer or a condition that could lead to cancer, you need Pap tests and screening for cancer for at least 20 years after your treatment.  If Pap tests have been discontinued, risk factors (such as a new sexual partner) need to be reassessed to determine if screening should be resumed.  The HPV test is an additional test that may be used for cervical cancer screening. The HPV test looks for the virus that can cause the cell changes on the cervix. The cells collected during the Pap test can be tested for HPV. The HPV test could be used to screen women aged 30 years and older, and should   be used in women of any age who have unclear Pap test results. After the age of 30, women should have HPV testing at the same frequency as a Pap test.  Colorectal cancer can be detected and often prevented. Most routine colorectal cancer screening begins at the age of 50 and continues through age 75. However, your caregiver may recommend screening at an earlier age if you have risk factors for colon cancer. On a yearly basis, your caregiver may provide home test kits to check for hidden blood in the stool. Use of a small camera at the end of a tube, to directly examine the colon (sigmoidoscopy or colonoscopy), can detect the earliest forms of colorectal cancer. Talk to your caregiver about this at age 50, when routine screening begins. Direct examination of the colon should be repeated every 5 to 10 years through age 75, unless early forms of pre-cancerous polyps or small growths are found.  Hepatitis C blood testing is recommended for all people born from 1945 through 1965 and any individual with known risks for hepatitis C.  Practice  safe sex. Use condoms and avoid high-risk sexual practices to reduce the spread of sexually transmitted infections (STIs). STIs include gonorrhea, chlamydia, syphilis, trichomonas, herpes, HPV, and human immunodeficiency virus (HIV). Herpes, HIV, and HPV are viral illnesses that have no cure. They can result in disability, cancer, and death. Sexually active women aged 25 and younger should be checked for chlamydia. Older women with new or multiple partners should also be tested for chlamydia. Testing for other STIs is recommended if you are sexually active and at increased risk.  Osteoporosis is a disease in which the bones lose minerals and strength with aging. This can result in serious bone fractures. The risk of osteoporosis can be identified using a bone density scan. Women ages 65 and over and women at risk for fractures or osteoporosis should discuss screening with their caregivers. Ask your caregiver whether you should take a calcium supplement or vitamin D to reduce the rate of osteoporosis.  Menopause can be associated with physical symptoms and risks. Hormone replacement therapy is available to decrease symptoms and risks. You should talk to your caregiver about whether hormone replacement therapy is right for you.  Use sunscreen with sun protection factor (SPF) of 30 or more. Apply sunscreen liberally and repeatedly throughout the day. You should seek shade when your shadow is shorter than you. Protect yourself by wearing long sleeves, pants, a wide-brimmed hat, and sunglasses year round, whenever you are outdoors.  Once a month, do a whole body skin exam, using a mirror to look at the skin on your back. Notify your caregiver of new moles, moles that have irregular borders, moles that are larger than a pencil eraser, or moles that have changed in shape or color.  Stay current with required immunizations.  Influenza. You need a dose every fall (or winter). The composition of the flu vaccine  changes each year, so being vaccinated once is not enough.  Pneumococcal polysaccharide. You need 1 to 2 doses if you smoke cigarettes or if you have certain chronic medical conditions. You need 1 dose at age 65 (or older) if you have never been vaccinated.  Tetanus, diphtheria, pertussis (Tdap, Td). Get 1 dose of Tdap vaccine if you are younger than age 65, are over 65 and have contact with an infant, are a healthcare worker, are pregnant, or simply want to be protected from whooping cough. After that, you need a Td   booster dose every 10 years. Consult your caregiver if you have not had at least 3 tetanus and diphtheria-containing shots sometime in your life or have a deep or dirty wound.  HPV. You need this vaccine if you are a woman age 26 or younger. The vaccine is given in 3 doses over 6 months.  Measles, mumps, rubella (MMR). You need at least 1 dose of MMR if you were born in 1957 or later. You may also need a second dose.  Meningococcal. If you are age 19 to 21 and a first-year college student living in a residence hall, or have one of several medical conditions, you need to get vaccinated against meningococcal disease. You may also need additional booster doses.  Zoster (shingles). If you are age 60 or older, you should get this vaccine.  Varicella (chickenpox). If you have never had chickenpox or you were vaccinated but received only 1 dose, talk to your caregiver to find out if you need this vaccine.  Hepatitis A. You need this vaccine if you have a specific risk factor for hepatitis A virus infection or you simply wish to be protected from this disease. The vaccine is usually given as 2 doses, 6 to 18 months apart.  Hepatitis B. You need this vaccine if you have a specific risk factor for hepatitis B virus infection or you simply wish to be protected from this disease. The vaccine is given in 3 doses, usually over 6 months. Preventive Services / Frequency Ages 19 to 39  Blood  pressure check.** / Every 1 to 2 years.  Lipid and cholesterol check.** / Every 5 years beginning at age 20.  Clinical breast exam.** / Every 3 years for women in their 20s and 30s.  Pap test.** / Every 2 years from ages 21 through 29. Every 3 years starting at age 30 through age 65 or 70 with a history of 3 consecutive normal Pap tests.  HPV screening.** / Every 3 years from ages 30 through ages 65 to 70 with a history of 3 consecutive normal Pap tests.  Hepatitis C blood test.** / For any individual with known risks for hepatitis C.  Skin self-exam. / Monthly.  Influenza immunization.** / Every year.  Pneumococcal polysaccharide immunization.** / 1 to 2 doses if you smoke cigarettes or if you have certain chronic medical conditions.  Tetanus, diphtheria, pertussis (Tdap, Td) immunization. / A one-time dose of Tdap vaccine. After that, you need a Td booster dose every 10 years.  HPV immunization. / 3 doses over 6 months, if you are 26 and younger.  Measles, mumps, rubella (MMR) immunization. / You need at least 1 dose of MMR if you were born in 1957 or later. You may also need a second dose.  Meningococcal immunization. / 1 dose if you are age 19 to 21 and a first-year college student living in a residence hall, or have one of several medical conditions, you need to get vaccinated against meningococcal disease. You may also need additional booster doses.  Varicella immunization.** / Consult your caregiver.  Hepatitis A immunization.** / Consult your caregiver. 2 doses, 6 to 18 months apart.  Hepatitis B immunization.** / Consult your caregiver. 3 doses usually over 6 months. Ages 40 to 64  Blood pressure check.** / Every 1 to 2 years.  Lipid and cholesterol check.** / Every 5 years beginning at age 20.  Clinical breast exam.** / Every year after age 40.  Mammogram.** / Every year beginning at age 40   and continuing for as long as you are in good health. Consult with your  caregiver.  Pap test.** / Every 3 years starting at age 30 through age 65 or 70 with a history of 3 consecutive normal Pap tests.  HPV screening.** / Every 3 years from ages 30 through ages 65 to 70 with a history of 3 consecutive normal Pap tests.  Fecal occult blood test (FOBT) of stool. / Every year beginning at age 50 and continuing until age 75. You may not need to do this test if you get a colonoscopy every 10 years.  Flexible sigmoidoscopy or colonoscopy.** / Every 5 years for a flexible sigmoidoscopy or every 10 years for a colonoscopy beginning at age 50 and continuing until age 75.  Hepatitis C blood test.** / For all people born from 1945 through 1965 and any individual with known risks for hepatitis C.  Skin self-exam. / Monthly.  Influenza immunization.** / Every year.  Pneumococcal polysaccharide immunization.** / 1 to 2 doses if you smoke cigarettes or if you have certain chronic medical conditions.  Tetanus, diphtheria, pertussis (Tdap, Td) immunization.** / A one-time dose of Tdap vaccine. After that, you need a Td booster dose every 10 years.  Measles, mumps, rubella (MMR) immunization. / You need at least 1 dose of MMR if you were born in 1957 or later. You may also need a second dose.  Varicella immunization.** / Consult your caregiver.  Meningococcal immunization.** / Consult your caregiver.  Hepatitis A immunization.** / Consult your caregiver. 2 doses, 6 to 18 months apart.  Hepatitis B immunization.** / Consult your caregiver. 3 doses, usually over 6 months. Ages 65 and over  Blood pressure check.** / Every 1 to 2 years.  Lipid and cholesterol check.** / Every 5 years beginning at age 20.  Clinical breast exam.** / Every year after age 40.  Mammogram.** / Every year beginning at age 40 and continuing for as long as you are in good health. Consult with your caregiver.  Pap test.** / Every 3 years starting at age 30 through age 65 or 70 with a 3  consecutive normal Pap tests. Testing can be stopped between 65 and 70 with 3 consecutive normal Pap tests and no abnormal Pap or HPV tests in the past 10 years.  HPV screening.** / Every 3 years from ages 30 through ages 65 or 70 with a history of 3 consecutive normal Pap tests. Testing can be stopped between 65 and 70 with 3 consecutive normal Pap tests and no abnormal Pap or HPV tests in the past 10 years.  Fecal occult blood test (FOBT) of stool. / Every year beginning at age 50 and continuing until age 75. You may not need to do this test if you get a colonoscopy every 10 years.  Flexible sigmoidoscopy or colonoscopy.** / Every 5 years for a flexible sigmoidoscopy or every 10 years for a colonoscopy beginning at age 50 and continuing until age 75.  Hepatitis C blood test.** / For all people born from 1945 through 1965 and any individual with known risks for hepatitis C.  Osteoporosis screening.** / A one-time screening for women ages 65 and over and women at risk for fractures or osteoporosis.  Skin self-exam. / Monthly.  Influenza immunization.** / Every year.  Pneumococcal polysaccharide immunization.** / 1 dose at age 65 (or older) if you have never been vaccinated.  Tetanus, diphtheria, pertussis (Tdap, Td) immunization. / A one-time dose of Tdap vaccine if you are over   65 and have contact with an infant, are a healthcare worker, or simply want to be protected from whooping cough. After that, you need a Td booster dose every 10 years.  Varicella immunization.** / Consult your caregiver.  Meningococcal immunization.** / Consult your caregiver.  Hepatitis A immunization.** / Consult your caregiver. 2 doses, 6 to 18 months apart.  Hepatitis B immunization.** / Check with your caregiver. 3 doses, usually over 6 months. ** Family history and personal history of risk and conditions may change your caregiver's recommendations. Document Released: 07/31/2001 Document Revised: 08/27/2011  Document Reviewed: 10/30/2010 [employer] Patient Information 2014 [employer].  

## 2013-01-26 NOTE — Telephone Encounter (Signed)
Call them back.  She needs it rarely and 30 would last her for an extended period of time.  She likely won't need #90 or 270 pills at a time. This is correct.

## 2013-01-26 NOTE — Patient Instructions (Addendum)
I would get a flu shot each fall.   I'll await the notes from the GYN clinic.  Take care.  Let me know if you have concerns.  Glad to see you.

## 2013-01-26 NOTE — Progress Notes (Signed)
  Subjective:    Patient ID: Judy Anderson, female    DOB: 1965-12-19, 47 y.o.   MRN: [id number]  Gynecologic Exam The patient's pertinent negatives include no pelvic pain or vaginal discharge. Pertinent negatives include no abdominal pain, chills, constipation, diarrhea, dysuria, fever, nausea, rash or vomiting.    Here for pap and pelvic and breast exam.  Seen for CPE today. Has stress.  Having regular cycles.  Review of Systems  Constitutional: Negative for fever and chills.  HENT: Negative for rhinorrhea, postnasal drip and sinus pressure.   Respiratory: Negative for shortness of breath.   Cardiovascular: Negative for palpitations.  Gastrointestinal: Negative for nausea, vomiting, abdominal pain, diarrhea and constipation.  Endocrine: Negative for polydipsia.  Genitourinary: Negative for dysuria, vaginal discharge, menstrual problem and pelvic pain.  Musculoskeletal: Positive for myalgias.  Skin: Negative for rash.       Objective:   Physical Exam  Vitals reviewed. Constitutional: She is oriented to person, place, and time. She appears well-developed and well-nourished.  HENT:  Head: Normocephalic and atraumatic.  Eyes: No scleral icterus.  Neck: Neck supple. No thyromegaly present.  Cardiovascular: Normal rate and regular rhythm.   No murmur heard. Pulmonary/Chest: Effort normal and breath sounds normal. No respiratory distress. Right breast exhibits no inverted nipple, no mass and no nipple discharge. Left breast exhibits no inverted nipple, no mass and no nipple discharge. Breasts are symmetrical.  Abdominal: Soft. She exhibits no mass. There is no tenderness.  Genitourinary: Vagina normal and uterus normal. Uterus is not tender. Right adnexum displays no mass and no tenderness. Left adnexum displays no mass and no tenderness.  Musculoskeletal: Normal range of motion.  Neurological: She is alert and oriented to person, place, and time.  Skin: Skin is warm and dry. No rash  noted.  Psychiatric: She has a normal mood and affect.          Assessment & Plan:  Needs mammogram Pap with HPV today--if negative, may go 5 years between paps.

## 2013-01-26 NOTE — Progress Notes (Signed)
CPE- See plan.  Routine anticipatory guidance given to patient.  See health maintenance. Tetanus 2010 Flu yearly D/w pt about diet and exercise.  Weight gain likely related to social factors- job lay off, caring for parents.  Discussed.  She is looking into her options.   Pap/pelvic/breast per gyn.  Labs d/w pt.  Unremarkable.  Living will d/w pt.  She would have her friend Judy Anderson designated if she were incapacitated.    R wrist strain improved now. Had some pain after moving furniture.   H/o palpitations with rare sx, uses CCB about once a month.  No ADE.  No sx now.   PMH and SH reviewed  Meds, vitals, and allergies reviewed.   ROS: See HPI.  Otherwise negative.    GEN: nad, alert and oriented HEENT: mucous membranes moist NECK: supple w/o LA CV: rrr. PULM: ctab, no inc wob ABD: soft, +bs EXT: no edema SKIN: no acute rash R wrist with normal inspection, ROM and not ttp  Distally nv intact.

## 2013-01-26 NOTE — Telephone Encounter (Signed)
[facility] [address] is requesting clarification on cardizem script sent to them today.  Instructions are for one by mouth 3 times a day, but the quantity is for #30 with 12 refills.  Please advise.  Fax is on your desk.

## 2013-01-26 NOTE — Assessment & Plan Note (Signed)
Routine anticipatory guidance given to patient.  See health maintenance. Tetanus 2010 Flu yearly D/w pt about diet and exercise.  Weight gain likely related to social factors- job lay off, caring for parents.  Discussed.  She is looking into her options.   Pap/pelvic/breast per gyn.  Labs d/w pt.  Unremarkable.  Living will d/w pt.  She would have her friend Judy Anderson designated if she were incapacitated.

## 2013-01-26 NOTE — Assessment & Plan Note (Signed)
Controlled with rare CCB and doing well.  Continue prn dosing.

## 2013-01-27 NOTE — Telephone Encounter (Signed)
Advised pharmacist at [facility].

## 2013-02-13 ENCOUNTER — Encounter: Payer: BC Managed Care – HMO | Admitting: Family Medicine

## 2013-03-05 ENCOUNTER — Telehealth: Payer: Self-pay | Admitting: Family Medicine

## 2013-03-05 DIAGNOSIS — Z029 Encounter for administrative examinations, unspecified: Secondary | ICD-10-CM

## 2013-03-05 NOTE — Telephone Encounter (Signed)
Dimple Nanas dropped off a Health Examination Certificate for Dr. Para March to complete. Her PPD is scheduled for tomorrow, 03/06/13 at 8 am and will return Monday 03/09/13 to have read and pick up completed form at that time. Her best contact number is [phone].  Thanks, Revonda Standard

## 2013-03-05 NOTE — Telephone Encounter (Signed)
The form is done except for the TB test results and my signature.  I can sign it Monday when she is here for the TB test.  Thanks

## 2013-03-06 ENCOUNTER — Ambulatory Visit (INDEPENDENT_AMBULATORY_CARE_PROVIDER_SITE_OTHER): Payer: BC Managed Care – HMO | Admitting: *Deleted

## 2013-03-06 DIAGNOSIS — Z029 Encounter for administrative examinations, unspecified: Secondary | ICD-10-CM

## 2013-03-09 LAB — TB SKIN TEST
Induration: 0 mm
TB Skin Test: NEGATIVE

## 2013-03-09 NOTE — Addendum Note (Signed)
Addended by: Annamarie Major on: 03/09/2013 11:58 AM   Modules accepted: Orders

## 2013-07-23 ENCOUNTER — Encounter: Payer: Self-pay | Admitting: Internal Medicine

## 2013-07-23 ENCOUNTER — Ambulatory Visit (INDEPENDENT_AMBULATORY_CARE_PROVIDER_SITE_OTHER): Payer: BC Managed Care – PPO | Admitting: Internal Medicine

## 2013-07-23 ENCOUNTER — Encounter: Payer: Self-pay | Admitting: General Practice

## 2013-07-23 VITALS — BP 124/82 | HR 75 | Temp 99.1°F | Resp 16 | Wt 191.2 lb

## 2013-07-23 DIAGNOSIS — J0100 Acute maxillary sinusitis, unspecified: Secondary | ICD-10-CM

## 2013-07-23 MED ORDER — CLARITHROMYCIN ER 500 MG PO TB24: 1000.0000 mg | ORAL_TABLET | Freq: Every day | ORAL | Status: DC

## 2013-07-23 NOTE — Patient Instructions (Signed)

## 2013-07-23 NOTE — Progress Notes (Signed)
   Subjective:    Patient ID: Judy Anderson, female    DOB: 1966-06-05, 48 y.o.   MRN: [id number]  HPI  This started as pain in her forehead and sneezing two weeks ago.  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  At this time the major symptoms or pain in the maxillary sinuses and green nasal discharge.  She has had chills without documented fever.        Review of Systems  She denies any cough, sputum production, shortness breath, or wheezing Pain in her neck which radiates to her spine upon awakening this am.This has resolved.     Objective:   Physical Exam  General appearance:good health ;well nourished; no acute distress or increased work of breathing is present.  No  lymphadenopathy about the head, neck, or axilla noted. Appears younger than stated age  Eyes: No conjunctival inflammation or lid edema is present.  Ears:  External ear exam shows no significant lesions or deformities.  Otoscopic examination reveals clear canals, tympanic membranes are intact bilaterally without bulging, retraction, inflammation or discharge.  Nose:  External nasal examination shows no deformity or inflammation. Nasal mucosa are dry & inflammed without lesions or exudates. No septal dislocation or deviation.No obstruction to airflow.   Oral exam: Dental hygiene is good; lips and gums are healthy appearing.There is no oropharyngeal erythema or exudate noted.Braces  Neck:  No deformities, thyromegaly, masses, or tenderness noted.   Supple with full range of motion without pain.   Heart:  Normal rate and regular rhythm. S1 and S2 normal without gallop, murmur, click, rub or other extra sounds.   Lungs:Chest clear to auscultation; no wheezes, rhonchi,rales ,or rubs present.No increased work of breathing.    Extremities:  No cyanosis, edema, or clubbing  noted    Skin: Warm & dry        Assessment & Plan:  #1 rhinosinusitis without  bronchitis  Plan:  Nasal hygiene interventions discussed. See prescription medications

## 2013-07-23 NOTE — Progress Notes (Signed)
Pre visit review using our clinic review tool, if applicable. No additional management support is needed unless otherwise documented below in the visit note. 

## 2013-08-07 ENCOUNTER — Ambulatory Visit (INDEPENDENT_AMBULATORY_CARE_PROVIDER_SITE_OTHER): Payer: BC Managed Care – PPO | Admitting: Family Medicine

## 2013-08-07 ENCOUNTER — Encounter: Payer: Self-pay | Admitting: Family Medicine

## 2013-08-07 VITALS — BP 112/76 | HR 70 | Temp 98.6°F | Wt 195.5 lb

## 2013-08-07 DIAGNOSIS — S060X0A Concussion without loss of consciousness, initial encounter: Secondary | ICD-10-CM

## 2013-08-07 NOTE — Patient Instructions (Signed)
Shut it down- limit reading, TV, noise, and exercise as much as possible.   Call back with update early next week.  Out of work for now.
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
## 2013-08-09 ENCOUNTER — Encounter: Payer: Self-pay | Admitting: Family Medicine

## 2013-08-09 DIAGNOSIS — S060X0A Concussion without loss of consciousness, initial encounter: Secondary | ICD-10-CM | POA: Insufficient documentation

## 2013-08-09 NOTE — Assessment & Plan Note (Signed)
Path phys d/w pt.  Improved in meantime. Not fully resolved. Stressed "shutting down" activity in meantime to allow for more rapid and better sx resolution. Will have graded return to activity and work as tolerated when sx resolve.  Not driving now.  D/w pt in detail. She agrees. >25 minutes spent in face to face time with patient, >50% spent in counselling or coordination of care.

## 2013-08-10 ENCOUNTER — Telehealth: Payer: Self-pay

## 2013-08-10 NOTE — Telephone Encounter (Signed)
I would expect her to have varying symptoms, all gradually improving.  It sounds like she is some better.  Muscular pain is likely at this point, okay to use a heating pad and ibuprofen (taken with food).  Caution on getting up quickly.  Would continue as is, out of work for the next few days.  Please have her update [place] in about 48 hours.  Thanks.

## 2013-08-10 NOTE — Telephone Encounter (Signed)
Patient notified as instructed by telephone. 

## 2013-08-10 NOTE — Telephone Encounter (Signed)
Pt left v/m; pt seen 08/07/13 for concussion; H/A are not as bad; occasional H/A on and off at top right side back of head; rt eye still weak and face is still swollen. Pt having back pain in spine this morning pt questions if muscular; pain level 7;pt has had weakness this morning but was weakness and lightheaded on 08/09/13 was worse, pt almost felt like going to pass out but did not pass out. Pt request cb.

## 2013-08-12 NOTE — Telephone Encounter (Signed)
If she is making slow progress, then that would be expected. She can take plain claritin 10mg  a day for the rhinorrhea if needed.  Please try to get her on the schedule for early next week for a recheck.  I would still have her out of work in the meantime, since her sx are not fully resolved.   Thanks.

## 2013-08-12 NOTE — Telephone Encounter (Signed)
Patient notified as instructed by telephone. Follow-up appointment scheduled. 

## 2013-08-12 NOTE — Telephone Encounter (Signed)
Pt left v/m; still sensitivity to light; eye looks 3D at times, h/a is better but still soreness at top of head near the back of head, pt still having neck pain,swelling has gone down in face.pt is very fatigued, head congestion with nasal drainage. Still pain in spine and muscular pain. Pt request cb.

## 2013-08-17 ENCOUNTER — Encounter: Payer: Self-pay | Admitting: Family Medicine

## 2013-08-17 ENCOUNTER — Ambulatory Visit (INDEPENDENT_AMBULATORY_CARE_PROVIDER_SITE_OTHER): Payer: BC Managed Care – PPO | Admitting: Family Medicine

## 2013-08-17 VITALS — BP 126/84 | HR 76 | Temp 98.5°F | Wt 197.2 lb

## 2013-08-17 DIAGNOSIS — S060X0A Concussion without loss of consciousness, initial encounter: Secondary | ICD-10-CM

## 2013-08-17 NOTE — Progress Notes (Signed)
Pre visit review using our clinic review tool, if applicable. No additional management support is needed unless otherwise documented below in the visit note.  Concussion f/u.  No HA, nausea, vomiting.  Balance still affect with fast but not slow walking.  Blurry vision with prolonged reading.  Overall improved. Less ear ringing.  Less, but not zero, light and sound sensitivity.   Meds, vitals, and allergies reviewed.   ROS: See HPI.  Otherwise, noncontributory.  GEN: nad, alert and oriented HEENT: mucous membranes moist NECK: supple w/o LA CV: rrr.   PULM: ctab, no inc wob ABD: soft, +bs CN 2-12 wnl B, S/S/DTR wnl x4

## 2013-08-17 NOTE — Patient Instructions (Signed)
Out of work for now.  Update in me about 1 week, sooner if needed.  Take care.   Continue resting and limiting exertion/noise/etc.

## 2013-08-17 NOTE — Assessment & Plan Note (Signed)
Improved, but not ready to go back to work.  Will update me in about 1 week, sooner if needed.  Will likely have graded return to work. D/w pt.  She agrees. Continue rest in meantime.

## 2013-08-19 ENCOUNTER — Telehealth: Payer: Self-pay | Admitting: Family Medicine

## 2013-08-19 DIAGNOSIS — S060X0A Concussion without loss of consciousness, initial encounter: Secondary | ICD-10-CM

## 2013-08-19 NOTE — Telephone Encounter (Signed)
Pt called and request referral to Dr.Flier with [facility]; p) [phone] f) [phone] for continuing sound (tearing sound) in head. Please advise.

## 2013-08-19 NOTE — Telephone Encounter (Signed)
Patient notified as instructed by telephone. Advised patient that the referral coordinator will be in touch with her to get this set up. 

## 2013-08-19 NOTE — Telephone Encounter (Signed)
Okay to refer, ordered.  Thanks.

## 2013-08-24 ENCOUNTER — Telehealth: Payer: Self-pay | Admitting: Family Medicine

## 2013-08-24 ENCOUNTER — Encounter: Payer: Self-pay | Admitting: *Deleted

## 2013-08-24 ENCOUNTER — Telehealth: Payer: Self-pay

## 2013-08-24 NOTE — Telephone Encounter (Signed)
Pt left v/m; pt still complains with dizziness and nausea; pt could not go to work today due to feeling bad, pt still has pain on rt side and back, pt has appt with neurologist 09/01/13; pt request note faxed to her employer [employer] fax # [phone]. Pt will bring FMLA papers by office to be filled out. Pt request cb when letter faxed.

## 2013-08-24 NOTE — Telephone Encounter (Signed)
Pt dropped off FMLA forms to be completed.  I have completed these forms, with the exception of an area that Dr. Damita Dunnings needs to review/complete (this section is marked w/a yellow flag).  Once Dr. Damita Dunnings has review/completed/signed the forms please return to me.  The forms are in a red folder, which I have placed in your inbox to pass on to Dr. Damita Dunnings. Thank you.

## 2013-08-24 NOTE — Telephone Encounter (Signed)
Note faxed.

## 2013-08-24 NOTE — Telephone Encounter (Signed)
Please fax a note over- out of work until cleared by MD.  Will address FMLA papers when I get them here in the clinic.  Thanks.

## 2013-08-24 NOTE — Telephone Encounter (Signed)
Done, thanks

## 2013-08-26 NOTE — Telephone Encounter (Signed)
Faxed completed forms to [employer] [facility] [employer] per pt's request and also mailed copies to pt for her records.

## 2013-08-31 ENCOUNTER — Encounter: Payer: Self-pay | Admitting: Cardiovascular Disease

## 2013-08-31 ENCOUNTER — Ambulatory Visit (INDEPENDENT_AMBULATORY_CARE_PROVIDER_SITE_OTHER): Payer: BC Managed Care – PPO | Admitting: Cardiovascular Disease

## 2013-08-31 VITALS — BP 118/90 | HR 63 | Ht 62.0 in | Wt 198.5 lb

## 2013-08-31 DIAGNOSIS — R0602 Shortness of breath: Secondary | ICD-10-CM

## 2013-08-31 DIAGNOSIS — R002 Palpitations: Secondary | ICD-10-CM

## 2013-08-31 DIAGNOSIS — R000 Tachycardia, unspecified: Secondary | ICD-10-CM

## 2013-08-31 DIAGNOSIS — E669 Obesity, unspecified: Secondary | ICD-10-CM | POA: Insufficient documentation

## 2013-08-31 MED ORDER — DILTIAZEM HCL 30 MG PO TABS: 30.0000 mg | ORAL_TABLET | Freq: Three times a day (TID) | ORAL | Status: AC | PRN

## 2013-08-31 NOTE — Assessment & Plan Note (Signed)
No significant tachycardia or palpitations. Diltiazem was refilled and we suggested she take this only as needed for recurrent symptoms

## 2013-08-31 NOTE — Assessment & Plan Note (Signed)
We have encouraged continued exercise, careful diet management in an effort to lose weight. 

## 2013-08-31 NOTE — Patient Instructions (Signed)
You are doing well. No medication changes were made.  Please call us if you have new issues that need to be addressed before your next appt.  Your physician wants you to follow-up in: 12 months.  You will receive a reminder letter in the mail two months in advance. If you don't receive a letter, please call our office to schedule the follow-up appointment. 
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
## 2013-08-31 NOTE — Assessment & Plan Note (Signed)
Symptoms presenting at rest , not associated with exertion. Less likely cardiac etiology. Encouraged a regular walking program

## 2013-09-01 ENCOUNTER — Ambulatory Visit (INDEPENDENT_AMBULATORY_CARE_PROVIDER_SITE_OTHER): Payer: BC Managed Care – PPO | Admitting: Neurology

## 2013-09-01 ENCOUNTER — Encounter: Payer: Self-pay | Admitting: Neurology

## 2013-09-01 VITALS — BP 108/66 | HR 62 | Resp 18 | Ht 62.0 in | Wt 198.0 lb

## 2013-09-01 DIAGNOSIS — F0781 Postconcussional syndrome: Secondary | ICD-10-CM

## 2013-09-01 DIAGNOSIS — R209 Unspecified disturbances of skin sensation: Secondary | ICD-10-CM

## 2013-09-01 DIAGNOSIS — R200 Anesthesia of skin: Secondary | ICD-10-CM | POA: Insufficient documentation

## 2013-09-01 MED ORDER — AMITRIPTYLINE HCL 25 MG PO TABS: 25.0000 mg | ORAL_TABLET | Freq: Every day | ORAL | Status: DC
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
## 2013-09-01 NOTE — Patient Instructions (Addendum)
1. MRI brain without contrast 1 @10am   please arrive 15 minutes prior for check in [place] 2. MRI cervical spine without contrast 3. Start physical therapy for neck and leg pain post-fall 4. Start amitriptyline 25mg  at bedtime. Call our office in 2 weeks to update on status 5. Follow-up in 6 weeks

## 2013-09-16 ENCOUNTER — Ambulatory Visit (HOSPITAL_COMMUNITY): Payer: BC Managed Care – PPO

## 2013-09-16 ENCOUNTER — Ambulatory Visit (HOSPITAL_COMMUNITY)
Admission: RE | Admit: 2013-09-16 | Discharge: 2013-09-16 | Disposition: A | Discharge status: Home / Self Care | Payer: BC Managed Care – PPO | Source: Ambulatory Visit | Attending: Neurology | Admitting: Neurology

## 2013-09-16 DIAGNOSIS — R200 Anesthesia of skin: Secondary | ICD-10-CM

## 2013-09-16 DIAGNOSIS — F0781 Postconcussional syndrome: Secondary | ICD-10-CM | POA: Insufficient documentation

## 2013-09-30 ENCOUNTER — Ambulatory Visit: Payer: BC Managed Care – PPO | Admitting: Rehabilitative and Restorative Service Providers"

## 2013-09-30 ENCOUNTER — Ambulatory Visit: Payer: BC Managed Care – PPO | Attending: Neurology | Admitting: Rehabilitative and Restorative Service Providers"

## 2013-10-02 ENCOUNTER — Telehealth: Payer: Self-pay | Admitting: Neurology

## 2013-10-02 NOTE — Telephone Encounter (Signed)
Please call [facility] regarding this pt. CB# [phone] / Sherri S.

## 2013-10-02 NOTE — Telephone Encounter (Signed)
Spoke to PT she is requesting most recent office and imaging reports

## 2013-10-23 ENCOUNTER — Ambulatory Visit (INDEPENDENT_AMBULATORY_CARE_PROVIDER_SITE_OTHER): Payer: BC Managed Care – PPO | Admitting: Neurology

## 2013-10-23 ENCOUNTER — Encounter: Payer: Self-pay | Admitting: Neurology

## 2013-10-23 VITALS — BP 105/60 | HR 86 | Temp 98.1°F | Ht 62.0 in | Wt 197.3 lb

## 2013-10-23 DIAGNOSIS — R200 Anesthesia of skin: Secondary | ICD-10-CM

## 2013-10-23 DIAGNOSIS — M79606 Pain in leg, unspecified: Secondary | ICD-10-CM | POA: Insufficient documentation

## 2013-10-23 DIAGNOSIS — D518 Other vitamin B12 deficiency anemias: Secondary | ICD-10-CM

## 2013-10-23 DIAGNOSIS — M79609 Pain in unspecified limb: Secondary | ICD-10-CM

## 2013-10-23 DIAGNOSIS — M79603 Pain in arm, unspecified: Secondary | ICD-10-CM

## 2013-10-23 DIAGNOSIS — R209 Unspecified disturbances of skin sensation: Secondary | ICD-10-CM

## 2013-10-23 DIAGNOSIS — F0781 Postconcussional syndrome: Secondary | ICD-10-CM

## 2013-10-23 MED ORDER — CYANOCOBALAMIN 1000 MCG/ML IJ SOLN
1000.0000 ug | Freq: Once | INTRAMUSCULAR | Status: AC
  Administered 2013-10-23: 1000 ug via INTRAMUSCULAR

## 2013-10-23 NOTE — Patient Instructions (Signed)
1. Continue amitriptyline 25mg  at bedtime 2. Continue physical therapy 3. Follow-up in 3-4 months

## 2013-10-23 NOTE — Progress Notes (Addendum)
NEUROLOGY FOLLOW UP OFFICE NOTE  Judy Anderson [id number]  HISTORY OF PRESENT ILLNESS: I had the pleasure of seeing Judy Anderson in follow-up in the neurology clinic on 10/23/2013.  The patient was last seen 6 weeks ago for symptoms consistent with post-concussive syndrome after she slipped on ice in February 2015.  She was reporting headaches, dizziness, cognitive changes, as well as intermittent numbness over the right side of her face, as well as over the right arm and leg.  I personally reviewed MRI brain and C-spine which were unremarkable, with mild spondylosis at C5-6 and C6-7.  She started amitriptyline for post-concussive headaches.  She is happy to report that the headaches have resolved.  She is not experiencing the right facial numbness anymore as well.  She is tolerating amitriptyline without side effects.  She has started physical therapy and noticed an improvement in her right-sided symptoms, although continues to have some pain in the right forearm and hand.  The popping in her neck is better.  She started pool therapy.    HPI:  This is a very pleasant 48 yo RH woman with a history of tachycardia who was in her usual state of health until 08/05/2013 when she slipped on ice and fell on her back with her right leg extended. Since then, she has had headaches, dizziness described as a floating sensation, nausea, tinnitus, and cognitive changes with slowed processing. These symptoms have gradually improved over the past 3 weeks, however she reported continued daily dull headaches over the occipital and bilateral parietal regions, left more than right. She has also been having recurrent episodes of numbness over the right side of her face with blurred vision on the right eye. Loud sounds on the right side, fatigue, and "intense conversations" trigger the numbness and tingling on the right side of her face. She has had to put cotton or tissue in her right ear due to this. Her mother has told her  the right side of her face looks swollen at times. She has noticed bilateral proximal arm weakness and fatigability where she has a difficult time fixing her hair. When she fell she heard a pop in her neck and has had neck pain and shock-like sensations from the right wrist down her hand, as well as right-sided back pain with shock-like sensations going down her right leg. She has been taking prn acetaminophen for the leg pain. She has pain on the right elbow and occasional numbness on her right palm. She has to move and bend down slowly, otherwise she gets off balance.   PAST MEDICAL HISTORY: Past Medical History  Diagnosis Date  . History of chicken pox   . Allergic rhinitis   . Blood in stool     hx of, colonoscopy in 2010, no cancer, repeat in 2015- Dr. Penelope Coop  . Fibroids     per gyn clinic   . Back pain     resolved with physical therapy-2010  . Palpitations   . Concussion     2015  . Concussion     went to [place]: Current Outpatient Prescriptions on File Prior to Visit  Medication Sig Dispense Refill  . amitriptyline (ELAVIL) 25 MG tablet Take 1 tablet (25 mg total) by mouth at bedtime.  30 tablet  6  . diltiazem (CARDIZEM) 30 MG tablet Take 1 tablet (30 mg total) by mouth 3 (three) times daily as needed.  30 tablet  11  . Multiple Vitamin (  MULTIVITAMIN) tablet Take 1 tablet by mouth daily.        . [DISCONTINUED] albuterol (PROVENTIL HFA;VENTOLIN HFA) 108 (90 BASE) MCG/ACT inhaler Inhale 1-2 puffs into the lungs every 4 (four) hours as needed for wheezing (or for cough).  18 g  1   No current facility-administered medications on file prior to visit.    ALLERGIES: Allergies  Allergen Reactions  . Penicillins     REACTION: Throat swells    FAMILY HISTORY: Family History  Problem Relation Age of Onset  . Adopted: Yes  . Hyperlipidemia Mother   . Hypertension Father   . Hyperlipidemia Father   . Heart disease Father     CABG  . Cancer Paternal  Aunt     unknown primary  . Breast cancer Neg Hx     none known  . Colon cancer Neg Hx     SOCIAL HISTORY: History   Social History  . Marital Status: Single    Spouse Name: N/A    Number of Children: N/A  . Years of Education: N/A   Occupational History  . teacher's assistant at [employer] History Main Topics  . Smoking status: Never Smoker   . Smokeless tobacco: Never Used  . Alcohol Use: No  . Drug Use: No  . Sexual Activity: No   Other Topics Concern  . Not on file   Social History Narrative   [employer] grad and finished her masters 2011.   Worked as Engineer, technical sales, also with [employer].    Working in call center as of 2014   Single    REVIEW OF SYSTEMS: Constitutional: No fevers, chills, or sweats, no generalized fatigue, change in appetite Eyes: occasional blurred vision, no double vision, eye pain Ear, nose and throat: No hearing loss, ear pain, nasal congestion, sore throat Cardiovascular: No chest pain, palpitations Respiratory:  No shortness of breath at rest or with exertion, wheezes GastrointestinaI: No nausea, vomiting, diarrhea, abdominal pain, fecal incontinence Genitourinary:  No dysuria, urinary retention or frequency Musculoskeletal:  No neck pain, back pain Integumentary: No rash, pruritus, skin lesions Neurological: as above Psychiatric: No depression, insomnia, anxiety Endocrine: No palpitations, fatigue, diaphoresis, mood swings, change in appetite, change in weight, increased thirst Hematologic/Lymphatic:  No anemia, purpura, petechiae. Allergic/Immunologic: no itchy/runny eyes, nasal congestion, recent allergic reactions, rashes  PHYSICAL EXAM: Filed Vitals:   10/23/13 1051  BP: 105/60  Pulse: 86  Temp: 98.1 F (36.7 C)   General: No acute distress Head:  Normocephalic/atraumatic Neck: supple, no paraspinal tenderness, full range of motion Heart:  Regular rate and rhythm Lungs:  Clear to  auscultation bilaterally Back: No paraspinal tenderness Skin/Extremities: No rash, no edema Neurological Exam: alert and oriented to person, place, and time. No aphasia or dysarthria. Fund of knowledge is appropriate.  Recent and remote memory are intact.  Attention and concentration are normal.    Able to name objects and repeat phrases. Cranial nerves: Pupils equal, round, reactive to light.  Fundoscopic exam unremarkable, no papilledema. Extraocular movements intact with no nystagmus. Visual fields full. Facial sensation intact. No facial asymmetry. Tongue, uvula, palate midline.  Motor: Bulk and tone normal, muscle strength 5/5 throughout with no pronator drift.  Sensation to light touch, temperature and vibration intact.  No extinction to double simultaneous stimulation.  Deep tendon reflexes 2+ throughout, toes downgoing.  Finger to nose testing intact.  Gait narrow-based and steady, able to tandem walk adequately.  Romberg negative.  IMPRESSION:  This is a pleasant 48 yo RH woman who slipped on ice last February 2015 and presented with symptoms consistent with post-concussion syndrome with headaches, dizziness, and cognitive changes, as well as right facial numbness and right arm/leg paresthesias and pain. MRI brain and cervical spine unremarkable except for mild spondylosis.  Symptoms have resolved, she is tolerating amitriptyline 25mg  qhs.  She will continue this for now, she still has some right arm and leg pain and occasional numbness and will continue with physical therapy.  If symptoms persist despite PT, consideration for EMG/NCV will be done.  She is looking at jobs where she will not be standing for prolonged periods, which I agree with. She stated she was here for a B12 shot at the front desk, and did receive the injection. A B12 level will be checked in 2 months to assess for further treatment need.  She will follow-up in 3-4 months.  Thank you for allowing me to participate in her care.   Please do not hesitate to call for any questions or concerns.  The duration of this appointment visit was 15 minutes of face-to-face time with the patient.  Greater than 50% of this time was spent in counseling, explanation of diagnosis, planning of further management, and coordination of care.   Ellouise Newer, M.D.

## 2013-11-06 ENCOUNTER — Encounter: Payer: Self-pay | Admitting: Neurology

## 2013-11-13 NOTE — Telephone Encounter (Signed)
Left voicemail on number listed for patient to call back to answer her questions.

## 2013-11-19 ENCOUNTER — Telehealth: Payer: Self-pay | Admitting: Neurology

## 2013-11-19 NOTE — Telephone Encounter (Signed)
Pt called/retrurning Dr. Amparo Bristol call from Friday.  Please call pt back.

## 2013-11-20 NOTE — Telephone Encounter (Signed)
Spoke to patient, visit note reviewed, per staff she stated she was here for a B12 shot at the front desk, and did receive the injection. No B12 level on file. Patient reports that she felt much better after the B12 shots and is wondering about oral treatment.  Would repeat a B12 level in July, and depending on level, we will treat accordingly.  Erica, pls put reminder to check a B12 level next month for Ms. Judy Anderson. Thanks!

## 2013-12-22 ENCOUNTER — Encounter: Payer: Self-pay | Admitting: Family Medicine

## 2013-12-22 NOTE — Telephone Encounter (Signed)
Letter mailed to patient as an appt reminder to call the office and schedule a follow-up appt.

## 2013-12-28 ENCOUNTER — Telehealth: Payer: Self-pay | Admitting: Neurology

## 2013-12-28 NOTE — Telephone Encounter (Signed)
Pt calling back from Thursday. Please call regarding disability. CB# [phone] / Sherri S.

## 2013-12-31 NOTE — Telephone Encounter (Signed)
Spoke with patient advised her to bring her disability paper to office so that we may complete our part.

## 2014-01-28 ENCOUNTER — Ambulatory Visit (INDEPENDENT_AMBULATORY_CARE_PROVIDER_SITE_OTHER): Payer: Self-pay | Admitting: Neurology

## 2014-01-28 ENCOUNTER — Encounter: Payer: Self-pay | Admitting: Neurology

## 2014-01-28 VITALS — BP 114/80 | HR 67 | Resp 16 | Ht 62.5 in | Wt 197.0 lb

## 2014-01-28 DIAGNOSIS — R209 Unspecified disturbances of skin sensation: Secondary | ICD-10-CM

## 2014-01-28 DIAGNOSIS — R200 Anesthesia of skin: Secondary | ICD-10-CM

## 2014-01-28 DIAGNOSIS — F0781 Postconcussional syndrome: Secondary | ICD-10-CM

## 2014-01-28 LAB — VITAMIN B12: VITAMIN B 12: 1353 pg/mL — AB (ref 211–911)

## 2014-01-28 NOTE — Patient Instructions (Signed)
1. Start tapering Amitriptyline: take 1/2 tablet daily for 1 week, then 1/2 tablet every other day for 1 week, then stop 2. Bloodwork for B12 level 3. Follow-up in 6 months

## 2014-01-28 NOTE — Progress Notes (Signed)
NEUROLOGY FOLLOW UP OFFICE NOTE  Judy Anderson [id number]  HISTORY OF PRESENT ILLNESS: I had the pleasure of seeing Judy Anderson in follow-up in the neurology clinic on 01/28/2014.  The patient was last seen 3 months ago. She was seen initially for symptoms consistent with post-concussive syndrome after she slipped on ice in February 2015. She was reporting headaches, dizziness, cognitive changes, as well as intermittent numbness over the right side of her face, as well as over the right arm and leg. I personally reviewed MRI brain and C-spine which were unremarkable, with mild spondylosis at C5-6 and C6-7. She started amitriptyline for post-concussive headaches with resolution of headaches. She has completed PT and is working out more and relaxing better. The leg pain has resolved. She reports that her right hand has felt tight a couple of times last month, otherwise no headaches, dizziness, or numbness.  She is looking for a different job and has been to several interviews. She had received a B12 shot on her last visit and reports this helped with fatigue, level will be checked today.  HPI: This is a very pleasant 48 yo RH woman with a history of tachycardia who was in her usual state of health until 08/05/2013 when she slipped on ice and fell on her back with her right leg extended. Since then, she has had headaches, dizziness described as a floating sensation, nausea, tinnitus, and cognitive changes with slowed processing. These symptoms have gradually improved over the past 3 weeks, however she reported continued daily dull headaches over the occipital and bilateral parietal regions, left more than right. She has also been having recurrent episodes of numbness over the right side of her face with blurred vision on the right eye. Loud sounds on the right side, fatigue, and "intense conversations" trigger the numbness and tingling on the right side of her face. She has had to put cotton or tissue in her  right ear due to this. Her mother has told her the right side of her face looks swollen at times. She has noticed bilateral proximal arm weakness and fatigability where she has a difficult time fixing her hair. When she fell she heard a pop in her neck and has had neck pain and shock-like sensations from the right wrist down her hand, as well as right-sided back pain with shock-like sensations going down her right leg. She has been taking prn acetaminophen for the leg pain. She has pain on the right elbow and occasional numbness on her right palm. She has to move and bend down slowly, otherwise she gets off balance.   PAST MEDICAL HISTORY: Past Medical History  Diagnosis Date  . History of chicken pox   . Allergic rhinitis   . Blood in stool     hx of, colonoscopy in 2010, no cancer, repeat in 2015- Dr. Penelope Coop  . Fibroids     per gyn clinic   . Back pain     resolved with physical therapy-2010  . Palpitations   . Concussion     2015  . Concussion     went to [place]: Current Outpatient Prescriptions on File Prior to Visit  Medication Sig Dispense Refill  . amitriptyline (ELAVIL) 25 MG tablet Take 1 tablet (25 mg total) by mouth at bedtime.  30 tablet  6  . diltiazem (CARDIZEM) 30 MG tablet Take 1 tablet (30 mg total) by mouth 3 (three) times daily as needed.  30 tablet  11  . Multiple Vitamin (MULTIVITAMIN) tablet Take 1 tablet by mouth daily.        . [DISCONTINUED] albuterol (PROVENTIL HFA;VENTOLIN HFA) 108 (90 BASE) MCG/ACT inhaler Inhale 1-2 puffs into the lungs every 4 (four) hours as needed for wheezing (or for cough).  18 g  1   No current facility-administered medications on file prior to visit.    ALLERGIES: Allergies  Allergen Reactions  . Penicillins     REACTION: Throat swells    FAMILY HISTORY: Family History  Problem Relation Age of Onset  . Adopted: Yes  . Hyperlipidemia Mother   . Hypertension Father   . Hyperlipidemia Father   .  Heart disease Father     CABG  . Cancer Paternal Aunt     unknown primary  . Breast cancer Neg Hx     none known  . Colon cancer Neg Hx     SOCIAL HISTORY: History   Social History  . Marital Status: Single    Spouse Name: N/A    Number of Children: N/A  . Years of Education: N/A   Occupational History  . 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 Smoking status: Never Smoker   . Smokeless tobacco: Never Used  . Alcohol Use: No  . Drug Use: No  . Sexual Activity: No   Other Topics Concern  . Not on file   Social History Narrative   [employer] grad and finished her masters 2011.   Worked as Engineer, technical sales, also with [employer].    Working in call center as of 2014   Single    REVIEW OF SYSTEMS: Constitutional: No fevers, chills, or sweats, no generalized fatigue, change in appetite Eyes: No visual changes, double vision, eye pain Ear, nose and throat: No hearing loss, ear pain, nasal congestion, sore throat Cardiovascular: No chest pain, palpitations Respiratory:  No shortness of breath at rest or with exertion, wheezes GastrointestinaI: No nausea, vomiting, diarrhea, abdominal pain, fecal incontinence Genitourinary:  No dysuria, urinary retention or frequency Musculoskeletal:  No neck pain, back pain Integumentary: No rash, pruritus, skin lesions Neurological: as above Psychiatric: No depression, insomnia, anxiety Endocrine: No palpitations, fatigue, diaphoresis, mood swings, change in appetite, change in weight, increased thirst Hematologic/Lymphatic:  No anemia, purpura, petechiae. Allergic/Immunologic: no itchy/runny eyes, nasal congestion, recent allergic reactions, rashes  PHYSICAL EXAM: Filed Vitals:   01/28/14 0835  BP: 114/80  Pulse: 67  Resp: 16   General: No acute distress Head:  Normocephalic/atraumatic Neck: supple, no paraspinal tenderness, full range of motion Heart:  Regular rate and  rhythm Lungs:  Clear to auscultation bilaterally Back: No paraspinal tenderness Skin/Extremities: No rash, no edema Neurological Exam: alert and oriented to person, place, and time. No aphasia or dysarthria. Fund of knowledge is appropriate.  Recent and remote memory are intact.  Attention and concentration are normal.    Able to name objects and repeat phrases. Cranial nerves: Pupils equal, round, reactive to light.  Fundoscopic exam unremarkable, no papilledema. Extraocular movements intact with no nystagmus. Visual fields full. Facial sensation intact. No facial asymmetry. Tongue, uvula, palate midline.  Motor: Bulk and tone normal, muscle strength 5/5 throughout with no pronator drift.  Sensation to light touch, temperature and vibration intact.  No extinction to double simultaneous stimulation.  Deep tendon reflexes 2+ throughout, toes downgoing.  Finger to nose testing intact.  Gait narrow-based and steady, able to tandem walk adequately.  Romberg negative.  IMPRESSION: This is a pleasant 48 yo RH woman who slipped on ice last February 2015 and presented with symptoms consistent with post-concussion syndrome with headaches, dizziness, and cognitive changes, as well as right facial numbness and right arm/leg paresthesias and pain. MRI brain and cervical spine unremarkable except for mild spondylosis. Symptoms have resolved, she will start tapering amitriptyline by 1/2 tablet every week.  Check B12 level today. She will follow-up in 6 months and knows to call our office if there are any problems in the interim.  Thank you for allowing me to participate in her care.  Please do not hesitate to call for any questions or concerns.  The duration of this appointment visit was 15 minutes of face-to-face time with the patient.  Greater than 50% of this time was spent in counseling, explanation of diagnosis, planning of further management, and coordination of care.   Ellouise Newer, M.D.   CC: Dr.  Damita Dunnings

## 2014-01-31 ENCOUNTER — Encounter: Payer: Self-pay | Admitting: Neurology

## 2014-03-08 ENCOUNTER — Telehealth: Payer: Self-pay | Admitting: Neurology

## 2014-03-08 NOTE — Telephone Encounter (Signed)
records request recv'd by mail from [facility]. Request forwarded via interoffice mail to HIM @[facility] [employer] / Gayleen Orem.

## 2014-03-19 ENCOUNTER — Telehealth: Payer: Self-pay | Admitting: Neurology

## 2014-03-19 NOTE — Telephone Encounter (Signed)
Records release recv'd from Disability. Forwarded via [employer] to [employer] / Gayleen Orem.

## 2014-04-16 ENCOUNTER — Telehealth: Payer: Self-pay | Admitting: Neurology

## 2014-04-16 NOTE — Telephone Encounter (Signed)
Pt called requesting to speak to a nurse regarding the disability report. She has some questions. Please call pt [phone]

## 2014-04-16 NOTE — Telephone Encounter (Signed)
Spoke with patient she states that she is still having leg pain with certain activities. She wants to know what your thoughts are on the type of work she will be able to do. She is concerned about starting a job and not being able to perform duties due to her leg issues, she doesn't want to regress since she has gotten some better. She is in the process of looking for work now. She just wants some clarity. 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 Please advise.

## 2014-04-20 NOTE — Telephone Encounter (Signed)
Called patient and notified of advisement. 

## 2014-04-20 NOTE — Telephone Encounter (Signed)
If she is having leg pain would activities, would recommend work that does not require significant standing and walking. Thanks

## 2014-06-25 ENCOUNTER — Ambulatory Visit (INDEPENDENT_AMBULATORY_CARE_PROVIDER_SITE_OTHER): Payer: Self-pay | Admitting: Family Medicine

## 2014-06-25 ENCOUNTER — Encounter: Payer: Self-pay | Admitting: Family Medicine

## 2014-06-25 VITALS — BP 122/68 | HR 62 | Temp 98.5°F | Ht 62.5 in | Wt 201.2 lb

## 2014-06-25 DIAGNOSIS — E538 Deficiency of other specified B group vitamins: Secondary | ICD-10-CM

## 2014-06-25 DIAGNOSIS — R002 Palpitations: Secondary | ICD-10-CM

## 2014-06-25 DIAGNOSIS — Z23 Encounter for immunization: Secondary | ICD-10-CM

## 2014-06-25 DIAGNOSIS — Z7189 Other specified counseling: Secondary | ICD-10-CM

## 2014-06-25 DIAGNOSIS — Z0000 Encounter for general adult medical examination without abnormal findings: Secondary | ICD-10-CM

## 2014-06-25 LAB — VITAMIN B12: Vitamin B-12: 816 pg/mL (ref 211–911)

## 2014-06-25 LAB — BASIC METABOLIC PANEL
BUN: 17 mg/dL (ref 6–23)
CHLORIDE: 106 meq/L (ref 96–112)
CO2: 27 mEq/L (ref 19–32)
Calcium: 9.4 mg/dL (ref 8.4–10.5)
Creatinine, Ser: 0.9 mg/dL (ref 0.4–1.2)
GFR: 83.24 mL/min (ref >60.00)
Glucose, Bld: 92 mg/dL (ref 70–99)
Potassium: 4.7 mEq/L (ref 3.5–5.1)
SODIUM: 140 meq/L (ref 135–145)

## 2014-06-25 NOTE — Patient Instructions (Addendum)
Please call in 09/2013 about a mammogram. Call your GI clinic about a follow up appointment. Go to the lab on the way out.  We'll contact you with your lab report. Take care. Glad to see you.  Keep exercising.

## 2014-06-25 NOTE — Progress Notes (Signed)
Pre visit review using our clinic review tool, if applicable. No additional management support is needed unless otherwise documented below in the visit note.  CPE- See plan.  Routine anticipatory guidance given to patient.  See health maintenance. Tetanus 2010 PNA and shingles not due. D/w pt.   Flu today.   Pap 2014 Mammogram d/w pt.  She'll call in 09/2013 about a mammogram. DXA not due.  Living will d/w pt.  Would have her cousin Merry Proud (and his wife Karena Addison) designated if patient were incapacitated.   Colonoscopy prev done by Dr. Penelope Coop.  Diet and exercise d/w pt. Working out and doing well.   She no longer has concussion sx. She had neuro f/u prev.  She is losing some weight and feels better.    H/o B12 elevation. Off B12 now.  Due for f/u lab.    H/o palpitations, prn CCB, not needed frequently.  No CP, SOB, BLE.    PMH and SH reviewed  Meds, vitals, and allergies reviewed.   ROS: See HPI.  Otherwise negative.    GEN: nad, alert and oriented HEENT: mucous membranes moist NECK: supple w/o LA CV: rrr. PULM: ctab, no inc wob ABD: soft, +bs EXT: no edema SKIN: no acute rash

## 2014-06-27 DIAGNOSIS — Z7189 Other specified counseling: Secondary | ICD-10-CM | POA: Insufficient documentation

## 2014-06-27 DIAGNOSIS — E538 Deficiency of other specified B group vitamins: Secondary | ICD-10-CM | POA: Insufficient documentation

## 2014-06-27 NOTE — Assessment & Plan Note (Signed)
Routine anticipatory guidance given to patient.  See health maintenance. Tetanus 2010 PNA and shingles not due. D/w pt.   Flu today.   Pap 2014 Mammogram d/w pt.  She'll call in 09/2013 about a mammogram. DXA not due.  Living will d/w pt.  Would have her cousin Merry Proud (and his wife Karena Addison) designated if patient were incapacitated.   Colonoscopy prev done by Dr. Penelope Coop.  Diet and exercise d/w pt. Working out and doing well.

## 2014-06-27 NOTE — Assessment & Plan Note (Signed)
Resolved, see notes on labs.

## 2014-06-27 NOTE — Assessment & Plan Note (Signed)
Recheck bmet, continue as is.  Doing well.

## 2014-06-28 ENCOUNTER — Encounter: Payer: Self-pay | Admitting: *Deleted

## 2014-08-02 ENCOUNTER — Ambulatory Visit: Payer: Self-pay | Admitting: Neurology

## 2014-08-06 ENCOUNTER — Encounter: Payer: Self-pay | Admitting: Neurology

## 2014-08-06 ENCOUNTER — Ambulatory Visit (INDEPENDENT_AMBULATORY_CARE_PROVIDER_SITE_OTHER): Payer: Self-pay | Admitting: Neurology

## 2014-08-06 VITALS — BP 130/88 | HR 56 | Resp 16 | Ht 62.5 in | Wt 204.0 lb

## 2014-08-06 DIAGNOSIS — F0781 Postconcussional syndrome: Secondary | ICD-10-CM

## 2014-08-06 NOTE — Progress Notes (Signed)
NEUROLOGY FOLLOW UP OFFICE NOTE  Judy Anderson [id number]  HISTORY OF PRESENT ILLNESS: I had the pleasure of seeing Judy Anderson in follow-up in the neurology clinic on 08/06/2014.  The patient was last seen 6 months ago. She was seen initially for symptoms consistent with post-concussive syndrome after she slipped on ice in February 2015. She was reporting headaches, dizziness, cognitive changes, as well as intermittent numbness over the right side of her face, as well as over the right arm and leg. I personally reviewed MRI brain and C-spine which were unremarkable, with mild spondylosis at C5-6 and C6-7. She started amitriptyline for post-concussive headaches with resolution of headaches. The headaches had resolved by her last visit, and she was given a tapering schedule for amitriptyline. She has also completed PT. She denies any further headaches, dizziness, memory changes, numbness, or leg pain. She has some neck strain when lifting weights.   HPI: This is a very pleasant 49 yo RH woman with a history of tachycardia who was in her usual state of health until 08/05/2013 when she slipped on ice and fell on her back with her right leg extended. Since then, she had headaches, dizziness described as a floating sensation, nausea, tinnitus, and cognitive changes with slowed processing. These symptoms have gradually improved, however she reported continued daily dull headaches over the occipital and bilateral parietal regions, left more than right. She has also been having recurrent episodes of numbness over the right side of her face with blurred vision on the right eye. Loud sounds on the right side, fatigue, and "intense conversations" trigger the numbness and tingling on the right side of her face. She has had to put cotton or tissue in her right ear due to this. Her mother has told her the right side of her face looks swollen at times. She has noticed bilateral proximal arm weakness and fatigability  where she has a difficult time fixing her hair. When she fell she heard a pop in her neck and has had neck pain and shock-like sensations from the right wrist down her hand, as well as right-sided back pain with shock-like sensations going down her right leg. She has been taking prn acetaminophen for the leg pain. She has pain on the right elbow and occasional numbness on her right palm. She has to move and bend down slowly, otherwise she gets off balance.   PAST MEDICAL HISTORY: Past Medical History  Diagnosis Date  . History of chicken pox   . Allergic rhinitis   . Blood in stool     hx of, colonoscopy in 2010, no cancer  . Fibroids     per gyn clinic   . Back pain     resolved with physical therapy-2010  . Palpitations   . Concussion     went to [place]: Current Outpatient Prescriptions on File Prior to Visit  Medication Sig Dispense Refill  . Multiple Vitamin (MULTIVITAMIN) tablet Take 1 tablet by mouth daily.      Marland Kitchen diltiazem (CARDIZEM) 30 MG tablet Take 1 tablet (30 mg total) by mouth 3 (three) times daily as needed. (Patient not taking: Reported on 08/06/2014) 30 tablet 11  . [DISCONTINUED] albuterol (PROVENTIL HFA;VENTOLIN HFA) 108 (90 BASE) MCG/ACT inhaler Inhale 1-2 puffs into the lungs every 4 (four) hours as needed for wheezing (or for cough). 18 g 1   No current facility-administered medications on file prior to visit.    ALLERGIES: Allergies  Allergen  Reactions  . Penicillins     REACTION: Throat swells    FAMILY HISTORY: Family History  Problem Relation Age of Onset  . Adopted: Yes  . Hyperlipidemia Mother   . Hypertension Father   . Hyperlipidemia Father   . Heart disease Father     CABG  . Cancer Paternal Aunt     unknown primary  . Breast cancer Neg Hx     none known  . Colon cancer Neg Hx     SOCIAL HISTORY: History   Social History  . Marital Status: Single    Spouse Name: N/A  . Number of Children: N/A  . Years of  Education: N/A   Occupational History  . teacher's assistant at [employer] History Main Topics  . Smoking status: Never Smoker   . Smokeless tobacco: Never Used  . Alcohol Use: No  . Drug Use: No  . Sexual Activity: No   Other Topics Concern  . Not on file   Social History Narrative   [employer] grad and finished her masters 2011.   Worked as Therapist, sports, also with [employer].    Working at [employer] as 2015   Single    REVIEW OF SYSTEMS: Constitutional: No fevers, chills, or sweats, no generalized fatigue, change in appetite Eyes: No visual changes, double vision, eye pain Ear, nose and throat: No hearing loss, ear pain, nasal congestion, sore throat Cardiovascular: No chest pain, palpitations Respiratory:  No shortness of breath at rest or with exertion, wheezes GastrointestinaI: No nausea, vomiting, diarrhea, abdominal pain, fecal incontinence Genitourinary:  No dysuria, urinary retention or frequency Musculoskeletal:  No neck pain, back pain Integumentary: No rash, pruritus, skin lesions Neurological: as above Psychiatric: No depression, insomnia, anxiety Endocrine: No palpitations, fatigue, diaphoresis, mood swings, change in appetite, change in weight, increased thirst Hematologic/Lymphatic:  No anemia, purpura, petechiae. Allergic/Immunologic: no itchy/runny eyes, nasal congestion, recent allergic reactions, rashes  PHYSICAL EXAM: Filed Vitals:   08/06/14 0902  BP: 130/88  Pulse: 56  Resp: 16   General: No acute distress Head:  Normocephalic/atraumatic Neck: supple, no paraspinal tenderness, full range of motion Heart:  Regular rate and rhythm Lungs:  Clear to auscultation bilaterally Back: No paraspinal tenderness Skin/Extremities: No rash, no edema Neurological Exam: alert and oriented to person, place, and time. No aphasia or dysarthria. Fund of knowledge is appropriate.  Recent and remote memory are intact.   Attention and concentration are normal.    Able to name objects and repeat phrases. Cranial nerves: Pupils equal, round, reactive to light.  Fundoscopic exam unremarkable, no papilledema. Extraocular movements intact with no nystagmus. Visual fields full. Facial sensation intact. No facial asymmetry. Tongue, uvula, palate midline.  Motor: Bulk and tone normal, muscle strength 5/5 throughout with no pronator drift.  Sensation to light touch, temperature and vibration intact.  No extinction to double simultaneous stimulation.  Deep tendon reflexes 2+ throughout, toes downgoing.  Finger to nose testing intact.  Gait narrow-based and steady, able to tandem walk adequately.  Romberg negative.  IMPRESSION: This is a pleasant 49 yo RH woman who slipped on ice last February 2015 and presented with symptoms consistent with post-concussion syndrome with headaches, dizziness, and cognitive changes, as well as right facial numbness and right arm/leg paresthesias and pain. MRI brain and cervical spine unremarkable except for mild spondylosis. Symptoms have resolved, she is now off amitriptyline with no recurrence of symptoms. Neurological exam normal. She will follow-up on  a prn basis and knows to call our office if there are any problems.  Thank you for allowing me to participate in her care.  Please do not hesitate to call for any questions or concerns.  The duration of this appointment visit was 15 minutes of face-to-face time with the patient.  Greater than 50% of this time was spent in counseling, explanation of diagnosis, planning of further management, and coordination of care.   Ellouise Newer, M.D.   CC: Dr. Damita Dunnings

## 2014-08-06 NOTE — Patient Instructions (Signed)
1. Call our office for any changes 2. Follow-up on an as needed basis

## 2014-10-14 ENCOUNTER — Encounter: Payer: Self-pay | Admitting: Family Medicine

## 2014-10-14 ENCOUNTER — Ambulatory Visit (INDEPENDENT_AMBULATORY_CARE_PROVIDER_SITE_OTHER): Payer: Self-pay | Admitting: Family Medicine

## 2014-10-14 VITALS — BP 104/70 | HR 66 | Temp 98.8°F | Wt 205.8 lb

## 2014-10-14 DIAGNOSIS — R609 Edema, unspecified: Secondary | ICD-10-CM

## 2014-10-14 MED ORDER — HYDROCHLOROTHIAZIDE 25 MG PO TABS: 12.5000 mg | ORAL_TABLET | Freq: Every day | ORAL | Status: AC | PRN

## 2014-10-14 NOTE — Patient Instructions (Signed)
Go to the lab on the way out.  We'll contact you with your lab report. Drink enough water to keep your urine clear.  Limit salt.   Keep exercising.  Try compression/support hose/socks and try HCTZ.  It may make you lightheaded, so start with a half tab.  Take care.  Glad to see you.

## 2014-10-14 NOTE — Progress Notes (Signed)
Pre visit review using our clinic review tool, if applicable. No additional management support is needed unless otherwise documented below in the visit note.  Her HA are resolved.  Not on TCA.    No use of CCB recently.  No palpitations.  BLE edema for the last few weeks.  Some better now, after drinking more water.  She cut some salt.  No CP.  Not SOB.  Some swelling prev but not this much, as over the last few weeks.  Less edema in the AM, more as the day goes on.  She has been exercising more, and that helped the edema.  The edema causes her legs to get a little sore.    Meds, vitals, and allergies reviewed.   ROS: See HPI.  Otherwise, noncontributory.  nad ncat mmm rrr ctab abd soft 1+ BLE edema with normal DP pulses

## 2014-10-15 ENCOUNTER — Ambulatory Visit: Payer: Self-pay | Admitting: Family Medicine

## 2014-10-15 DIAGNOSIS — R609 Edema, unspecified: Secondary | ICD-10-CM | POA: Insufficient documentation

## 2014-10-15 LAB — BASIC METABOLIC PANEL
BUN: 17 mg/dL (ref 6–23)
CHLORIDE: 102 meq/L (ref 96–112)
CO2: 29 mEq/L (ref 19–32)
Calcium: 9.8 mg/dL (ref 8.4–10.5)
Creatinine, Ser: 0.91 mg/dL (ref 0.40–1.20)
GFR: 84.19 mL/min (ref >60.00)
Glucose, Bld: 82 mg/dL (ref 70–99)
Potassium: 5 mEq/L (ref 3.5–5.1)
Sodium: 135 mEq/L (ref 135–145)

## 2014-10-15 NOTE — Assessment & Plan Note (Addendum)
D/w pt.  Continue liberal water intake, limit salt, continue exercise.  Can try compression stockings vs HCTZ with routine cautions on HCTZ.  She agrees.  Update me as needed.  Clear chest.  No PND, no sign of failure.  Likely just benign peripheral edema, worsened with salt load prev.

## 2015-01-07 ENCOUNTER — Ambulatory Visit (INDEPENDENT_AMBULATORY_CARE_PROVIDER_SITE_OTHER): Payer: Self-pay | Admitting: Family Medicine

## 2015-01-07 ENCOUNTER — Encounter: Payer: Self-pay | Admitting: Family Medicine

## 2015-01-07 DIAGNOSIS — S060X0A Concussion without loss of consciousness, initial encounter: Secondary | ICD-10-CM

## 2015-01-07 DIAGNOSIS — S39012A Strain of muscle, fascia and tendon of lower back, initial encounter: Secondary | ICD-10-CM | POA: Insufficient documentation

## 2015-01-07 MED ORDER — CYCLOBENZAPRINE HCL 10 MG PO TABS: 10.0000 mg | ORAL_TABLET | Freq: Every evening | ORAL | Status: DC | PRN

## 2015-01-07 MED ORDER — DICLOFENAC SODIUM 75 MG PO TBEC: 75.0000 mg | DELAYED_RELEASE_TABLET | Freq: Two times a day (BID) | ORAL | Status: DC

## 2015-01-07 NOTE — Assessment & Plan Note (Signed)
Treat with heat, NSAIDs, home PT, and muscle relaxant prn.  Follow up if not improving in 2 week, expect to get worse in next 48 hours before it gets batter.

## 2015-01-07 NOTE — Progress Notes (Signed)
Pre visit review using our clinic review tool, if applicable. No additional management support is needed unless otherwise documented below in the visit note. 
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
## 2015-01-07 NOTE — Assessment & Plan Note (Signed)
Minor. Treat with full brain rest.  May restart activity if headache resolving.

## 2015-01-07 NOTE — Patient Instructions (Addendum)
Recommend total brain rest in 24 hours hour: no TV, no phone, no computer, no reading, no physical activity. If no headache can return to activity, but stop if headache returns.  Start diclofenac 1 tab twice daily for inflammation.  Can use muscle relaxant at night.  Heat on low back, start home stretching on low back and neck.  Call if not improving as expected.

## 2015-01-14 ENCOUNTER — Telehealth: Payer: Self-pay

## 2015-01-14 NOTE — Telephone Encounter (Signed)
PLEASE NOTE: All timestamps contained within this report are represented as [place] Standard Time. CONFIDENTIALTY NOTICE: This fax transmission is intended only for the addressee. It contains information that is legally privileged, confidential or otherwise protected from use or disclosure. If you are not the intended recipient, you are strictly prohibited from reviewing, disclosing, copying using or disseminating any of this information or taking any action in reliance on or regarding this information. If you have received this fax in error, please notify us immediately by telephone so that we can arrange for its return to [place]. Phone: [phone], Toll-Free: [phone], Fax: [phone] Page: 1 of 1 Call Id: [id number] [place] Patient Name: Judy Anderson RY Gender: Unknown DOB: 1966/06/12 Age: 50 Y 1 M 18 D Return Phone Number: [phone] (Primary) Address: City/State/Zip: [place] [address] Client [place] Night - Client Client Site [facility]ician Renford Dills Contact Type Call [place] Name Judy Anderson Phone Number [phone] Relationship To Patient Self Is this call to report lab results? No Call Type General Information Initial Comment Caller states she has a message and wants to know if she should go to a chiropractor for her back pain. General Information Type Message Only Nurse Assessment Guidelines Guideline Title Affirmed Question Affirmed Notes Nurse Date/Time (Eastern Time) Disp. Time Eilene Ghazi Time) Disposition Final User 01/13/2015 6:47:03 PM General Information Provided Yes [facility] Instructions Given Call Event Type User Date / Time Description

## 2015-01-14 NOTE — Telephone Encounter (Signed)
Left detailed message on voicemail.  

## 2015-01-14 NOTE — Telephone Encounter (Signed)
If not emergent sx, ie weakness, then likely okay for chiropractor eval.  Thanks.

## 2015-03-08 ENCOUNTER — Ambulatory Visit (INDEPENDENT_AMBULATORY_CARE_PROVIDER_SITE_OTHER): Payer: BLUE CROSS/BLUE SHIELD

## 2015-03-08 DIAGNOSIS — Z23 Encounter for immunization: Secondary | ICD-10-CM | POA: Diagnosis not present

## 2015-04-13 ENCOUNTER — Ambulatory Visit: Payer: Self-pay | Admitting: Cardiovascular Disease

## 2015-05-16 ENCOUNTER — Telehealth: Payer: Self-pay | Admitting: Family Medicine

## 2015-05-16 ENCOUNTER — Encounter: Payer: Self-pay | Admitting: Cardiovascular Disease

## 2015-05-16 ENCOUNTER — Ambulatory Visit (INDEPENDENT_AMBULATORY_CARE_PROVIDER_SITE_OTHER): Payer: BLUE CROSS/BLUE SHIELD | Admitting: Cardiovascular Disease

## 2015-05-16 VITALS — BP 110/75 | HR 70 | Ht 62.5 in | Wt 207.4 lb

## 2015-05-16 DIAGNOSIS — R002 Palpitations: Secondary | ICD-10-CM | POA: Diagnosis not present

## 2015-05-16 DIAGNOSIS — Z0000 Encounter for general adult medical examination without abnormal findings: Secondary | ICD-10-CM

## 2015-05-16 DIAGNOSIS — E669 Obesity, unspecified: Secondary | ICD-10-CM | POA: Diagnosis not present

## 2015-05-16 DIAGNOSIS — R600 Localized edema: Secondary | ICD-10-CM | POA: Diagnosis not present

## 2015-05-16 DIAGNOSIS — R000 Tachycardia, unspecified: Secondary | ICD-10-CM

## 2015-05-16 NOTE — Assessment & Plan Note (Signed)
I suspect her leg edema is secondary to venous insufficiency Recommended she use compression hose in the summertime, currently no significant edema

## 2015-05-16 NOTE — Assessment & Plan Note (Signed)
Currently working out a regular basis,feels good, does aerobic 30 minutes with no symptoms
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
## 2015-05-16 NOTE — Patient Instructions (Signed)
You are doing well. No medication changes were made.  Use compression hose (TED hose), with a zipper for leg swelling in the hot months  Please call us if you have new issues that need to be addressed before your next appt.  Your physician wants you to follow-up in: 12 months.  You will receive a reminder letter in the mail two months in advance. If you don't receive a letter, please call our office to schedule the follow-up appointment.

## 2015-05-16 NOTE — Assessment & Plan Note (Signed)
Denies any significant tachycardia. No changes to her medications

## 2015-05-16 NOTE — Telephone Encounter (Signed)
[facility]     Patient Name: Judy Anderson Initial Comment Caller stated she has had a cold for two weeks and wants to know what she can do to clear it up.   DOB: 1965-12-26      Nurse Assessment  Nurse: Genoveva Ill, RN, Lattie Haw Date/Time (Eastern Time): 05/16/2015 4:18:36 PM  Confirm and document reason for call. If symptomatic, describe symptoms. ---Caller stated she has had a cold sx with coughing mainly in head and mild intermittent sore throat for two weeks with nausea and watery eyes  Has the patient traveled out of the country within the last 30 days? ---No  Does the patient have any new or worsening symptoms? ---Yes  Will a triage be completed? ---Yes  Related visit to physician within the last 2 weeks? ---No  Does the PT have any chronic conditions? (i.e. diabetes, asthma, etc.) ---Yes  List chronic conditions. ---hx palpitations  Did the patient indicate they were pregnant? ---No  Is this a behavioral health call? ---No    Guidelines     Guideline Title Affirmed Question Affirmed Notes   Cough - Acute Productive [1] Nasal discharge AND [2] present > 10 days    Final Disposition User   See PCP When Office is Open (within 3 days) [place], RN, Lattie Haw   Comments   states has appt for 3:45 pm tomorrow with [name], but wants one later if possible   Appt at 3:45 tomorrow cancelled and rescheduled for 4:15pm with [name], NP   Referrals   REFERRED TO PCP OFFICE   Disagree/Comply: Comply

## 2015-05-16 NOTE — Telephone Encounter (Signed)
Pt has appt 05/17/15 at 4:15 with Avie Echevaria NP.

## 2015-05-17 ENCOUNTER — Ambulatory Visit: Payer: BLUE CROSS/BLUE SHIELD | Admitting: Primary Care

## 2015-05-17 ENCOUNTER — Ambulatory Visit (INDEPENDENT_AMBULATORY_CARE_PROVIDER_SITE_OTHER): Payer: BLUE CROSS/BLUE SHIELD | Admitting: Internal Medicine

## 2015-05-17 ENCOUNTER — Ambulatory Visit: Payer: BLUE CROSS/BLUE SHIELD | Admitting: Physician Assistant

## 2015-05-17 ENCOUNTER — Encounter: Payer: Self-pay | Admitting: Internal Medicine

## 2015-05-17 VITALS — BP 108/80 | HR 68 | Temp 98.8°F | Wt 201.0 lb

## 2015-05-17 DIAGNOSIS — J0100 Acute maxillary sinusitis, unspecified: Secondary | ICD-10-CM | POA: Diagnosis not present

## 2015-05-17 MED ORDER — DOXYCYCLINE HYCLATE 100 MG PO TABS: 100.0000 mg | ORAL_TABLET | Freq: Two times a day (BID) | ORAL | Status: DC

## 2015-05-17 NOTE — Progress Notes (Signed)
HPI  Pt presents to the clinic today with c/o nasal congestion, runny nose, sore throat and cough. The started 3 weeks ago. She is not blowing anything out of her nose. The cough is productive of yellow mucous. She has run low grade fevers, had chills and body aches. She has tried Mucinex without any relief. She does have a history of seasonal allergies. She has had sick contacts. She has had her flu shot.  Review of Systems      Past Medical History  Diagnosis Date  . History of chicken pox   . Allergic rhinitis   . Blood in stool     hx of, colonoscopy in 2010, no cancer  . Fibroids     per gyn clinic   . Back pain     resolved with physical therapy-2010  . Palpitations   . Concussion     went to [facility] History  Problem Relation Age of Onset  . Adopted: Yes  . Hyperlipidemia Mother   . Hypertension Father   . Hyperlipidemia Father   . Heart disease Father     CABG  . Cancer Paternal Aunt     unknown primary  . Breast cancer Neg Hx     none known  . Colon cancer Neg Hx     Social History   Social History  . Marital Status: Single    Spouse Name: N/A  . Number of Children: N/A  . Years of Education: N/A   Occupational History  . 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 Smoking status: Never Smoker   . Smokeless tobacco: Never Used  . Alcohol Use: No  . Drug Use: No  . Sexual Activity: No   Other Topics Concern  . Not on file   Social History Narrative   [employer] grad and finished her masters 2011.   Worked as Therapist, sports, also with [employer].    Working at [employer] as of 2016    Single    Allergies  Allergen Reactions  . Penicillins     REACTION: Throat swells     Constitutional: Positive headache, fatigue and fever. Denies abrupt weight changes.  HEENT:  Positive runny nose, sore throat. Denies eye redness, eye pain, pressure behind the eyes, facial pain, nasal  congestion, ear pain, ringing in the ears, wax buildup, or bloody nose. Respiratory: Positive cough. Denies difficulty breathing or shortness of breath.  Cardiovascular: Denies chest pain, chest tightness, palpitations or swelling in the hands or feet.   No other specific complaints in a complete review of systems (except as listed in HPI above).  Objective:   BP 108/80 mmHg  Pulse 68  Temp(Src) 98.8 F (37.1 C) (Oral)  Wt 201 lb (91.173 kg)  SpO2 98%  LMP 04/21/2015 Wt Readings from Last 3 Encounters:  05/17/15 201 lb (91.173 kg)  05/16/15 207 lb 6.4 oz (94.076 kg)  01/07/15 205 lb (92.987 kg)     General: Appears her stated age, well developed, well nourished in NAD. HEENT: Head: normal shape and size maxillary sinus tenderness; Eyes: sclera white, no icterus, conjunctiva pink; Ears: Tm's gray and intact, normal light reflex; Nose: mucosa boggy and moist, septum midline; Throat/Mouth: + PND. Teeth present, mucosa erythematous and moist, no exudate noted, no lesions or ulcerations noted.  Neck: No cervical lymphadenopathy.  Cardiovascular: Normal rate and rhythm. S1,S2 noted.  No murmur, rubs or gallops  noted.  Pulmonary/Chest: Normal effort and positive vesicular breath sounds. No respiratory distress. No wheezes, rales or ronchi noted.      Assessment & Plan:  Acute Maxillary Sinusitis:  Get some rest and drink plenty of water Do salt water gargles for the sore throat eRx for Doxycycline 100 mg BID Flonase BID x 3 days then daily Rx for Hycodan cough syrup  RTC as needed or if symptoms persist.

## 2015-05-17 NOTE — Patient Instructions (Signed)

## 2015-05-17 NOTE — Progress Notes (Signed)
Pre visit review using our clinic review tool, if applicable. No additional management support is needed unless otherwise documented below in the visit note. 

## 2015-06-20 ENCOUNTER — Other Ambulatory Visit: Payer: Self-pay | Admitting: Family Medicine

## 2015-06-20 DIAGNOSIS — R609 Edema, unspecified: Secondary | ICD-10-CM

## 2015-06-20 DIAGNOSIS — E538 Deficiency of other specified B group vitamins: Secondary | ICD-10-CM

## 2015-06-21 ENCOUNTER — Other Ambulatory Visit (INDEPENDENT_AMBULATORY_CARE_PROVIDER_SITE_OTHER): Payer: BLUE CROSS/BLUE SHIELD

## 2015-06-21 DIAGNOSIS — E538 Deficiency of other specified B group vitamins: Secondary | ICD-10-CM

## 2015-06-21 DIAGNOSIS — R609 Edema, unspecified: Secondary | ICD-10-CM | POA: Diagnosis not present

## 2015-06-21 LAB — BASIC METABOLIC PANEL
BUN: 18 mg/dL (ref 6–23)
CHLORIDE: 104 meq/L (ref 96–112)
CO2: 29 mEq/L (ref 19–32)
CREATININE: 0.96 mg/dL (ref 0.40–1.20)
Calcium: 9.9 mg/dL (ref 8.4–10.5)
GFR: 78.94 mL/min (ref >60.00)
Glucose, Bld: 118 mg/dL — ABNORMAL HIGH (ref 70–99)
Potassium: 4.9 mEq/L (ref 3.5–5.1)
Sodium: 139 mEq/L (ref 135–145)

## 2015-06-21 LAB — VITAMIN B12: Vitamin B-12: 896 pg/mL (ref 211–911)

## 2015-06-28 ENCOUNTER — Encounter: Payer: BLUE CROSS/BLUE SHIELD | Admitting: Family Medicine

## 2015-07-04 ENCOUNTER — Ambulatory Visit (INDEPENDENT_AMBULATORY_CARE_PROVIDER_SITE_OTHER): Payer: BLUE CROSS/BLUE SHIELD | Admitting: Family Medicine

## 2015-07-04 ENCOUNTER — Encounter: Payer: Self-pay | Admitting: Family Medicine

## 2015-07-04 VITALS — BP 116/78 | HR 63 | Temp 98.7°F | Ht 63.0 in | Wt 204.5 lb

## 2015-07-04 DIAGNOSIS — Z0000 Encounter for general adult medical examination without abnormal findings: Secondary | ICD-10-CM

## 2015-07-04 DIAGNOSIS — Z7189 Other specified counseling: Secondary | ICD-10-CM

## 2015-07-04 DIAGNOSIS — E538 Deficiency of other specified B group vitamins: Secondary | ICD-10-CM

## 2015-07-04 NOTE — Progress Notes (Signed)
Pre visit review using our clinic review tool, if applicable. No additional management support is needed unless otherwise documented below in the visit note.  CPE- See plan.  Routine anticipatory guidance given to patient.  See health maintenance. Tetanus 2010 Flu 2016 PNA not due Shingles not due Mammogram due, d/w pt.   Pap not d/w pt.   DXA not due.  D/w pt. Colonoscopy 2010   Living will d/w pt.  Would have her cousin Merry Proud (and his wife Karena Addison) and patient's pastor Dr. Lennette Bihari A. Jimmye Norman all equally designated if patient were incapacitated.   Diet and exercise d/w pt.  She has been working out, working on diet.   HIV prev neg 2011.   Sugar up, but not diabetic, d/w pt.   Palpitations- no recent sx.  Rare use of CCB.  Doing well.  No syncope.  Increased exercise tolerance noted as she has been working out more.     PMH and SH reviewed  Meds, vitals, and allergies reviewed.   ROS: See HPI.  Otherwise negative.    GEN: nad, alert and oriented HEENT: mucous membranes moist NECK: supple w/o LA CV: rrr. PULM: ctab, no inc wob ABD: soft, +bs EXT: no edema SKIN: no acute rash

## 2015-07-04 NOTE — Patient Instructions (Addendum)
You can call for a mammogram at: [place]  Take care.  Glad to see you.  Low carb diet.  Keep exercising.  Let me know if you have concerns.

## 2015-07-05 NOTE — Assessment & Plan Note (Signed)
wnl on check, d/w pt.

## 2015-07-05 NOTE — Assessment & Plan Note (Signed)
Tetanus 2010  Flu 2016  PNA not due  Shingles not due  Mammogram due, d/w pt.  Pap not d/w pt.  DXA not due. D/w pt.  Colonoscopy 2010  Living will d/w pt. Would have her cousin Merry Proud (and his wife Karena Addison) and patient's pastor Dr. Lennette Bihari A. Jimmye Norman all equally designated if patient were incapacitated.  Diet and exercise d/w pt. She has been working out, working on diet.  HIV prev neg 2011.  Sugar up, but not diabetic, d/w pt re: diet and exercise, handout given re: low carb foods.  Can recheck yearly.

## 2015-07-08 ENCOUNTER — Other Ambulatory Visit: Payer: Self-pay

## 2015-07-08 DIAGNOSIS — Z1231 Encounter for screening mammogram for malignant neoplasm of breast: Secondary | ICD-10-CM

## 2015-07-19 ENCOUNTER — Ambulatory Visit: Payer: BLUE CROSS/BLUE SHIELD

## 2015-07-21 ENCOUNTER — Ambulatory Visit: Payer: Self-pay

## 2015-09-12 ENCOUNTER — Emergency Department
Admission: EM | Admit: 2015-09-12 | Discharge: 2015-09-12 | Disposition: A | Discharge status: Home / Self Care | Payer: Self-pay | Attending: Emergency Medicine | Admitting: Emergency Medicine

## 2015-09-12 ENCOUNTER — Encounter: Payer: Self-pay | Admitting: Emergency Medicine

## 2015-09-12 DIAGNOSIS — Z880 Allergy status to penicillin: Secondary | ICD-10-CM | POA: Insufficient documentation

## 2015-09-12 DIAGNOSIS — Z791 Long term (current) use of non-steroidal anti-inflammatories (NSAID): Secondary | ICD-10-CM | POA: Insufficient documentation

## 2015-09-12 DIAGNOSIS — J029 Acute pharyngitis, unspecified: Secondary | ICD-10-CM | POA: Insufficient documentation

## 2015-09-12 DIAGNOSIS — Z79899 Other long term (current) drug therapy: Secondary | ICD-10-CM | POA: Insufficient documentation

## 2015-09-12 MED ORDER — PSEUDOEPH-BROMPHEN-DM 30-2-10 MG/5ML PO SYRP: 5.0000 mL | ORAL_SOLUTION | Freq: Four times a day (QID) | ORAL | Status: DC | PRN

## 2015-09-12 MED ORDER — PREDNISONE 20 MG PO TABS
60.0000 mg | ORAL_TABLET | Freq: Once | ORAL | Status: AC
  Administered 2015-09-12: 60 mg via ORAL
  Filled 2015-09-12: qty 3

## 2015-09-12 MED ORDER — LIDOCAINE VISCOUS 2 % MT SOLN: 5.0000 mL | Freq: Four times a day (QID) | OROMUCOSAL | Status: DC | PRN

## 2015-09-12 MED ORDER — DIPHENHYDRAMINE HCL 12.5 MG/5ML PO ELIX
25.0000 mg | ORAL_SOLUTION | Freq: Once | ORAL | Status: AC
  Administered 2015-09-12: 25 mg via ORAL
  Filled 2015-09-12: qty 10

## 2015-09-12 MED ORDER — LIDOCAINE VISCOUS 2 % MT SOLN
15.0000 mL | Freq: Once | OROMUCOSAL | Status: AC
  Administered 2015-09-12: 15 mL via OROMUCOSAL
  Filled 2015-09-12: qty 15

## 2015-09-12 MED ORDER — METHYLPREDNISOLONE 4 MG PO TBPK: ORAL_TABLET | ORAL | Status: DC

## 2015-09-12 NOTE — ED Notes (Signed)
Pt presents to ED with sore throat and difficulty swallowing. Pt states has had fever at home but does not know how high. Pt states has had body aches, chills, nasal congestion. Pt states her ears have been popping. Pt speaking in complete sentences. Pt in NAD in triage.

## 2015-09-12 NOTE — ED Provider Notes (Signed)
[facility] Emergency Department Provider Note  ____________________________________________  Time seen: Approximately 7:14 PM  I have reviewed the triage vital signs and the nursing notes.   HISTORY  Chief Complaint Sore Throat and Dysphagia    HPI Margaretta Cheff is a 50 y.o. female patient complaining of sore throat, nasal congestion, body aches, fever and chills, and ear pressure. Patient state complaints started last night. Patient denies any nausea vomiting diarrhea. Patient states she's taken a flu shot this season. Patient rates her pain discomfort as a 8/10. No palliative measures taken for this complaint.  Past Medical History  Diagnosis Date  . History of chicken pox   . Allergic rhinitis   . Blood in stool     hx of, colonoscopy in 2010, no cancer  . Fibroids     per gyn clinic   . Back pain     resolved with physical therapy-2010  . Palpitations   . Concussion     went to [facility]     Patient Active Problem List   Diagnosis Date Noted  . Edema 10/15/2014  . Advance care planning 06/27/2014  . B12 deficiency 06/27/2014  . Obesity 08/31/2013  . Routine general medical examination at a health care facility 01/26/2013  . Palpitations 07/25/2011    Past Surgical History  Procedure Laterality Date  . Uterine fibroid surgery  06/01/10    Myomectomy    Current Outpatient Rx  Name  Route  Sig  Dispense  Refill  . brompheniramine-pseudoephedrine-DM 30-2-10 MG/5ML syrup   Oral   Take 5 mLs by mouth 4 (four) times daily as needed. Mixed with 5 mL of viscous lidocaine for swish and swallow.   120 mL   0   . cyclobenzaprine (FLEXERIL) 10 MG tablet   Oral   Take 1 tablet (10 mg total) by mouth at bedtime as needed for muscle spasms.   15 tablet   0   . diclofenac (VOLTAREN) 75 MG EC tablet   Oral   Take 1 tablet (75 mg total) by mouth 2 (two) times daily.   30 tablet   0   . diltiazem (CARDIZEM) 30 MG tablet   Oral   Take  1 tablet (30 mg total) by mouth 3 (three) times daily as needed.   30 tablet   11   . hydrochlorothiazide (HYDRODIURIL) 25 MG tablet   Oral   Take 0.5-1 tablets (12.5-25 mg total) by mouth daily as needed (swelling).   30 tablet   1   . lidocaine (XYLOCAINE) 2 % solution   Mouth/Throat   Use as directed 5 mLs in the mouth or throat every 6 (six) hours as needed for mouth pain. Mixed with 5 mL of Bromfed-DM for swish and swallow.   100 mL   0   . methylPREDNISolone (MEDROL DOSEPAK) 4 MG TBPK tablet      Take Tapered dose as directed   21 tablet   0   . Multiple Vitamin (MULTIVITAMIN) tablet   Oral   Take 1 tablet by mouth daily.             Allergies Penicillins  Family History  Problem Relation Age of Onset  . Adopted: Yes  . Hyperlipidemia Mother   . Hypertension Father   . Hyperlipidemia Father   . Heart disease Father     CABG  . Cancer Paternal Aunt     unknown primary  . Breast cancer Neg Hx  none known  . Colon cancer Neg Hx     Social History Social History  Substance Use Topics  . Smoking status: Never Smoker   . Smokeless tobacco: Never Used  . Alcohol Use: No    Review of Systems Constitutional: Fever, chills , and body aches  Eyes: No visual changes. ENT: Sore throat and pain with swallowing. Cardiovascular: Denies chest pain. Respiratory: Denies shortness of breath. Gastrointestinal: No abdominal pain.  No nausea, no vomiting.  No diarrhea.  No constipation. Genitourinary: Negative for dysuria. Musculoskeletal: Negative for back pain. Skin: Negative for rash. Neurological: Negative for headaches, focal weakness or numbness. Allergic/Immunilogical: Penicillin  10-point ROS otherwise negative.  ____________________________________________   PHYSICAL EXAM:  VITAL SIGNS: ED Triage Vitals  Enc Vitals Group     BP 09/12/15 1833 144/75 mmHg     Pulse Rate 09/12/15 1833 70     Resp 09/12/15 1833 18     Temp 09/12/15 1833 99.1 F  (37.3 C)     Temp Source 09/12/15 1833 Oral     SpO2 09/12/15 1833 99 %     Weight 09/12/15 1833 165 lb (74.844 kg)     Height 09/12/15 1833 5\' 3"  (1.6 m)     Head Cir --      Peak Flow --      Pain Score 09/12/15 1834 8     Pain Loc --      Pain Edu? --      Excl. in [place]? --     Constitutional: Alert and oriented. Well appearing and in no acute distress. Eyes: Conjunctivae are normal. PERRL. EOMI. Head: Atraumatic. Nose: No congestion/rhinnorhea. Mouth/Throat: Mucous membranes are moist.  Oropharynx erythematous.Edematous but not exudative tonsils.  Neck: No stridor.  No cervical spine tenderness to palpation. Cardiovascular: Normal rate, regular rhythm. Grossly normal heart sounds.  Good peripheral circulation. Respiratory: Normal respiratory effort.  No retractions. Lungs CTAB. Gastrointestinal: Soft and nontender. No distention. No abdominal bruits. No CVA tenderness. Musculoskeletal: No lower extremity tenderness nor edema.  No joint effusions. Neurologic:  Normal speech and language. No gross focal neurologic deficits are appreciated. No gait instability. Skin:  Skin is warm, dry and intact. No rash noted. Psychiatric: Mood and affect are normal. Speech and behavior are normal.  ____________________________________________   LABS (all labs ordered are listed, but only abnormal results are displayed)  Labs Reviewed - No data to display ____________________________________________  EKG   ____________________________________________  RADIOLOGY   ____________________________________________   PROCEDURES  Procedure(s) performed: None  Critical Care performed: No  ____________________________________________   INITIAL IMPRESSION / ASSESSMENT AND PLAN / ED COURSE  Pertinent labs & imaging results that were available during my care of the patient were reviewed by me and considered in my medical decision making (see chart for details).  Pharyngitis with viral  illness. Discussed negative rapid strep test with patient. Patient given a prescription forBromfed DM, viscous lidocaine, and the Medrol Dosepak. Advised follow-up family doctor if no improvement in 3-4 days. ____________________________________________   FINAL CLINICAL IMPRESSION(S) / ED DIAGNOSES  Final diagnoses:  Acute pharyngitis, unspecified pharyngitis type      Sable Feil, PA-C 09/12/15 1932  Orbie Pyo, MD 09/12/15 2038

## 2015-09-12 NOTE — Discharge Instructions (Signed)

## 2015-09-15 ENCOUNTER — Encounter: Payer: Self-pay | Admitting: Family Medicine

## 2015-09-15 ENCOUNTER — Ambulatory Visit (INDEPENDENT_AMBULATORY_CARE_PROVIDER_SITE_OTHER): Payer: Self-pay | Admitting: Family Medicine

## 2015-09-15 VITALS — BP 118/82 | HR 80 | Temp 98.1°F | Wt 205.1 lb

## 2015-09-15 DIAGNOSIS — H109 Unspecified conjunctivitis: Secondary | ICD-10-CM

## 2015-09-15 MED ORDER — POLYMYXIN B-TRIMETHOPRIM 10000-0.1 UNIT/ML-% OP SOLN
1.0000 [drp] | Freq: Four times a day (QID) | OPHTHALMIC | Status: DC
Start: 2015-09-15 — End: 2016-08-03

## 2015-09-15 NOTE — Progress Notes (Signed)
Pre visit review using our clinic review tool, if applicable. No additional management support is needed unless otherwise documented below in the visit note. 

## 2015-09-15 NOTE — Patient Instructions (Signed)
Use the drop 4 x daily for 5-7 days.  Bacterial Conjunctivitis Bacterial conjunctivitis, commonly called pink eye, is an inflammation of the clear membrane that covers the white part of the eye (conjunctiva). The inflammation can also happen on the underside of the eyelids. The blood vessels in the conjunctiva become inflamed, causing the eye to become red or pink. Bacterial conjunctivitis may spread easily from one eye to another and from person to person (contagious).  CAUSES  Bacterial conjunctivitis is caused by bacteria. The bacteria may come from your own skin, your upper respiratory tract, or from someone else with bacterial conjunctivitis. SYMPTOMS  The normally white color of the eye or the underside of the eyelid is usually pink or red. The pink eye is usually associated with irritation, tearing, and some sensitivity to light. Bacterial conjunctivitis is often associated with a thick, yellowish discharge from the eye. The discharge may turn into a crust on the eyelids overnight, which causes your eyelids to stick together. If a discharge is present, there may also be some blurred vision in the affected eye. DIAGNOSIS  Bacterial conjunctivitis is diagnosed by your caregiver through an eye exam and the symptoms that you report. Your caregiver looks for changes in the surface tissues of your eyes, which may point to the specific type of conjunctivitis. A sample of any discharge may be collected on a cotton-tip swab if you have a severe case of conjunctivitis, if your cornea is affected, or if you keep getting repeat infections that do not respond to treatment. The sample will be sent to a lab to see if the inflammation is caused by a bacterial infection and to see if the infection will respond to antibiotic medicines. TREATMENT   Bacterial conjunctivitis is treated with antibiotics. Antibiotic eyedrops are most often used. However, antibiotic ointments are also available. Antibiotics pills are  sometimes used. Artificial tears or eye washes may ease discomfort. HOME CARE INSTRUCTIONS   To ease discomfort, apply a cool, clean washcloth to your eye for 10-20 minutes, 3-4 times a day.  Gently wipe away any drainage from your eye with a warm, wet washcloth or a cotton ball.  Wash your hands often with soap and water. Use paper towels to dry your hands.  Do not share towels or washcloths. This may spread the infection.  Change or wash your pillowcase every day.  You should not use eye makeup until the infection is gone.  Do not operate machinery or drive if your vision is blurred.  Stop using contact lenses. Ask your caregiver how to sterilize or replace your contacts before using them again. This depends on the type of contact lenses that you use.  When applying medicine to the infected eye, do not touch the edge of your eyelid with the eyedrop bottle or ointment tube. SEEK IMMEDIATE MEDICAL CARE IF:   Your infection has not improved within 3 days after beginning treatment.  You had yellow discharge from your eye and it returns.  You have increased eye pain.  Your eye redness is spreading.  Your vision becomes blurred.  You have a fever or persistent symptoms for more than 2-3 days.  You have a fever and your symptoms suddenly get worse.  You have facial pain, redness, or swelling. MAKE SURE YOU:   Understand these instructions.  Will watch your condition.  Will get help right away if you are not doing well or get worse.   This information is not intended to replace advice given  to you by your health care provider. Make sure you discuss any questions you have with your health care provider.   Document Released: 06/04/2005 Document Revised: 06/25/2014 Document Reviewed: 11/05/2011 Elsevier Interactive Patient Education [employer].

## 2015-09-15 NOTE — Assessment & Plan Note (Signed)
New problem. Covering for bacterial etiology with Polytrim.

## 2015-09-15 NOTE — Progress Notes (Signed)
   Subjective:  Patient ID: Judy Anderson, female    DOB: 01/07/1966  Age: 50 y.o. MRN: [id number]  CC: Eye redness/drainage.  HPI:  50 year old female presents to clinic today for an acute visit with the above complaint.  Patient states that she's had right eye redness and drainage since yesterday. She reports that the drainage is discolored/milky. She reports associated blurred vision. No photophobia. No associated fever or chills. She has been diagnosed recently with strep throat. No known exacerbating or relieving factors. No other complaints today.  Social Hx   Social History   Social History  . Marital Status: Single    Spouse Name: N/A  . Number of Children: N/A  . Years of Education: N/A   Occupational History  . 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 Smoking status: Never Smoker   . Smokeless tobacco: Never Used  . Alcohol Use: No  . Drug Use: No  . Sexual Activity: No   Other Topics Concern  . None   Social History Narrative   [employer] grad; finished her masters 2011.   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    Single   Review of Systems  Constitutional: Negative.   Eyes: Positive for discharge and redness.   Objective:  BP 118/82 mmHg  Pulse 80  Temp(Src) 98.1 F (36.7 C) (Oral)  Wt 205 lb 2 oz (93.044 kg)  SpO2 98%  LMP 08/03/2015  BP/Weight 09/15/2015 09/12/2015 [id number]  Systolic BP 123456 123456 [id number]  Diastolic BP 82 75 78  Wt. (Lbs) 205.13 165 204.5  BMI 36.35 29.24 36.23   Physical Exam  Constitutional: She is oriented to person, place, and time. She appears well-developed. No distress.  HENT:  Head: Normocephalic and atraumatic.  Eyes: Pupils are equal, round, and reactive to light.  Right eye with conjunctival injection. No appreciable drainage currently.  Cardiovascular: Normal rate and regular rhythm.   No murmur  heard. Pulmonary/Chest: Effort normal and breath sounds normal. She has no wheezes. She has no rales.  Neurological: She is alert and oriented to person, place, and time.  Vitals reviewed.  Lab Results  Component Value Date   WBC 8.8 07/25/2011   HGB 13.3 07/25/2011   HCT 38.6 07/25/2011   PLT 228.0 07/25/2011   GLUCOSE 118* 06/21/2015   CHOL 164 01/19/2013   TRIG 48.0 01/19/2013   HDL 66.10 01/19/2013   LDLCALC 88 01/19/2013   ALT 17 07/25/2011   AST 20 07/25/2011   NA 139 06/21/2015   K 4.9 06/21/2015   CL 104 06/21/2015   CREATININE 0.96 06/21/2015   BUN 18 06/21/2015   CO2 29 06/21/2015   TSH 0.45 07/25/2011    Assessment & Plan:   Problem List Items Addressed This Visit    Conjunctivitis - Primary    New problem. Covering for bacterial etiology with Polytrim.         Meds ordered this encounter  Medications  . trimethoprim-polymyxin b (POLYTRIM) ophthalmic solution    Sig: Place 1 drop into the right eye every 6 (six) hours.    Dispense:  10 mL    Refill:  1   Follow-up: PRN  [place]

## 2015-09-16 ENCOUNTER — Ambulatory Visit: Payer: Self-pay | Admitting: Family Medicine

## 2016-07-16 ENCOUNTER — Other Ambulatory Visit: Payer: Self-pay | Admitting: Family Medicine

## 2016-07-16 DIAGNOSIS — E538 Deficiency of other specified B group vitamins: Secondary | ICD-10-CM

## 2016-07-16 DIAGNOSIS — R609 Edema, unspecified: Secondary | ICD-10-CM

## 2016-07-27 ENCOUNTER — Other Ambulatory Visit (INDEPENDENT_AMBULATORY_CARE_PROVIDER_SITE_OTHER): Payer: Managed Care, Other (non HMO)

## 2016-07-27 DIAGNOSIS — R609 Edema, unspecified: Secondary | ICD-10-CM

## 2016-07-27 DIAGNOSIS — E538 Deficiency of other specified B group vitamins: Secondary | ICD-10-CM

## 2016-07-27 LAB — BASIC METABOLIC PANEL
BUN: 20 mg/dL (ref 6–23)
CHLORIDE: 100 meq/L (ref 96–112)
CO2: 29 mEq/L (ref 19–32)
CREATININE: 0.94 mg/dL (ref 0.40–1.20)
Calcium: 9.8 mg/dL (ref 8.4–10.5)
GFR: 80.52 mL/min (ref >60.00)
GLUCOSE: 87 mg/dL (ref 70–99)
POTASSIUM: 4.7 meq/L (ref 3.5–5.1)
Sodium: 137 mEq/L (ref 135–145)

## 2016-07-27 LAB — VITAMIN B12

## 2016-08-03 ENCOUNTER — Other Ambulatory Visit: Payer: Self-pay | Admitting: Family Medicine

## 2016-08-03 ENCOUNTER — Encounter: Payer: Self-pay | Admitting: Family Medicine

## 2016-08-03 ENCOUNTER — Ambulatory Visit (INDEPENDENT_AMBULATORY_CARE_PROVIDER_SITE_OTHER): Payer: Managed Care, Other (non HMO) | Admitting: Family Medicine

## 2016-08-03 VITALS — BP 118/80 | HR 62 | Temp 98.7°F | Ht 61.0 in | Wt 206.0 lb

## 2016-08-03 DIAGNOSIS — Z0000 Encounter for general adult medical examination without abnormal findings: Secondary | ICD-10-CM | POA: Diagnosis not present

## 2016-08-03 DIAGNOSIS — R609 Edema, unspecified: Secondary | ICD-10-CM

## 2016-08-03 DIAGNOSIS — R002 Palpitations: Secondary | ICD-10-CM

## 2016-08-03 DIAGNOSIS — E538 Deficiency of other specified B group vitamins: Secondary | ICD-10-CM

## 2016-08-03 DIAGNOSIS — Z1231 Encounter for screening mammogram for malignant neoplasm of breast: Secondary | ICD-10-CM

## 2016-08-03 MED ORDER — VITAMIN D3 25 MCG (1000 UNIT) PO TABS: 1000.0000 [IU] | ORAL_TABLET | Freq: Every day | ORAL | Status: AC

## 2016-08-03 NOTE — Assessment & Plan Note (Signed)
No sx, no need for recent HCTZ use, continue as is with diet and exercise. She agrees.  I thanked her for her effort with diet and exercise.

## 2016-08-03 NOTE — Assessment & Plan Note (Signed)
Okay to stop replacement.  We can recheck periodically.  D/w pt.  See AVS.

## 2016-08-03 NOTE — Patient Instructions (Addendum)
[facility] at [facility] [place]. [place],  [place]  [address] [address]: [phone]  Okay to stop B12/multivitamin.  Can take vitamin D.   Take care.  Glad to see you.

## 2016-08-03 NOTE — Assessment & Plan Note (Signed)
Tetanus 2010 Flu prev done, EMR prev updated PNA not due  Shingles not due  Mammogram due, d/w pt. She'll call about f/u. Pap due, d/w pt.  She'll call about f/u with Dr. Kennon Rounds.  D/w pt.   DXA not due. D/w pt.  Colonoscopy 2010  Living will d/w pt. Would have her cousin Merry Proud (and his wife Karena Addison) and patient's pastor Dr. Lennette Bihari A. Jimmye Norman all equally designated if patient were incapacitated.  Diet and exercise d/w pt. She has been working out, working on diet. Sugar wnl, d/w pt.  She has been working on low carb diet.   HIV prev neg 2011.

## 2016-08-03 NOTE — Progress Notes (Signed)
CPE- See plan.  Routine anticipatory guidance given to patient.  See health maintenance. Tetanus 2010 Flu prev done, EMR prev updated PNA not due  Shingles not due  Mammogram due, d/w pt. She'll call about f/u. Pap due, d/w pt.  She'll call about f/u with Dr. Kennon Rounds.  D/w pt.   DXA not due. D/w pt.  Colonoscopy 2010  Living will d/w pt. Would have her cousin Merry Proud (and his wife Karena Addison) and patient's pastor Dr. Lennette Bihari A. Jimmye Norman all equally designated if patient were incapacitated.  Diet and exercise d/w pt. She has been working out, working on diet. Sugar wnl, d/w pt.  She has been working on low carb diet.   HIV prev neg 2011.   B12 high, d/w pt. Currently on replacement with multivitamin.   Doesn't need replacement at this point.   D/w pt.   HCTZ used more for swelling than BP.  Used as needed.  Hasn't had to take medicine recently.  She doesn't need with the work on diet and exercise.    No recent CCB use, no palpitations.     No recent HA.    PMH and SH reviewed  Meds, vitals, and allergies reviewed.   ROS: Per HPI.  Unless specifically indicated otherwise in HPI, the patient denies:  General: fever. Eyes: acute vision changes ENT: sore throat Cardiovascular: chest pain Respiratory: SOB GI: vomiting GU: dysuria Musculoskeletal: acute back pain Derm: acute rash Neuro: acute motor dysfunction Psych: worsening mood Endocrine: polydipsia Heme: bleeding Allergy: hayfever  GEN: nad, alert and oriented HEENT: mucous membranes moist NECK: supple w/o LA CV: rrr. PULM: ctab, no inc wob ABD: soft, +bs EXT: no edema

## 2016-08-03 NOTE — Assessment & Plan Note (Signed)
No sx.  Doing well.  No need for CCB recently.

## 2016-08-23 ENCOUNTER — Ambulatory Visit (INDEPENDENT_AMBULATORY_CARE_PROVIDER_SITE_OTHER): Payer: Managed Care, Other (non HMO) | Admitting: Family Medicine

## 2016-08-23 ENCOUNTER — Encounter: Payer: Self-pay | Admitting: Family Medicine

## 2016-08-23 DIAGNOSIS — Z91018 Allergy to other foods: Secondary | ICD-10-CM

## 2016-08-23 NOTE — Progress Notes (Signed)
Pre visit review using our clinic review tool, if applicable. No additional management support is needed unless otherwise documented below in the visit note. 

## 2016-08-23 NOTE — Progress Notes (Signed)
She was fasting with church, late 07/2016 and into early 08/2016.   Water mainly for 5 days, with some occ juice.  No solids.   No allergic sx during the fast.   Then she drank some blended juice from mult plants and then had a reaction around her lips.  Some perioral swelling and puffiness, with some blisters around the mouth.  Itching on the face.  She didn't have rash o/w.  No wheeze.  No tongue swelling.  This was not similar to the previous reaction she had had with penicillin.  She didn't have intraoral findings.    Some BLE edema but she has been more sedentary recently and that may be unrelated.    That juice after the fast was a new item/flavor for her.  No other new food intake; she had other foods recently that were not a new item.   She had h/o external oral irritation with oranges prev.   Meds, vitals, and allergies reviewed.   ROS: Per HPI unless specifically indicated in ROS section   GEN: nad, alert and oriented HEENT: mucous membranes moist, op wnl, tongue wnl NECK: supple w/o LA, no stridor CV: rrr.  no murmur PULM: ctab, no inc wob, no wheeze.  ABD: soft, +bs SKIN: no acute rash

## 2016-08-23 NOTE — Patient Instructions (Addendum)
The presumption is that you have some oral allergy to one of the fruits in the drink. Check the label and see if you have known foods in the drink that can always take.  If there is an outlier, then that may be the issue.  The main issue is trigger avoidance.   If you have any other symptoms, then take claritin.  Update [place] as needed.   Take care.  Glad to see you.

## 2016-08-24 DIAGNOSIS — Z91018 Allergy to other foods: Secondary | ICD-10-CM | POA: Insufficient documentation

## 2016-08-24 NOTE — Assessment & Plan Note (Signed)
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 Likely/probably from a citrus food in the shaking she was drinking. Discussed with patient. No symptoms now. Asked her to go back and look at the list of ingredients. This may help her figure out which was the issue. She had a history of oral irritation with oranges. Trigger avoidance discussed with patient. We can refer to allergy clinic if needed but it does not look like this is needed now. Can take Claritin if needed. She has no angioedema symptoms. Okay for outpatient follow-up. She agrees.

## 2016-09-04 ENCOUNTER — Ambulatory Visit (INDEPENDENT_AMBULATORY_CARE_PROVIDER_SITE_OTHER): Payer: Managed Care, Other (non HMO) | Admitting: Family Medicine

## 2016-09-04 ENCOUNTER — Ambulatory Visit
Admission: RE | Admit: 2016-09-04 | Discharge: 2016-09-04 | Disposition: A | Discharge status: Home / Self Care | Payer: Managed Care, Other (non HMO) | Source: Ambulatory Visit | Attending: Family Medicine | Admitting: Family Medicine

## 2016-09-04 ENCOUNTER — Encounter: Payer: Self-pay | Admitting: Family Medicine

## 2016-09-04 VITALS — BP 131/83 | HR 60 | Resp 18 | Ht 62.0 in | Wt 203.0 lb

## 2016-09-04 DIAGNOSIS — Z0000 Encounter for general adult medical examination without abnormal findings: Secondary | ICD-10-CM | POA: Diagnosis not present

## 2016-09-04 DIAGNOSIS — Z01419 Encounter for gynecological examination (general) (routine) without abnormal findings: Secondary | ICD-10-CM

## 2016-09-04 DIAGNOSIS — Z124 Encounter for screening for malignant neoplasm of cervix: Secondary | ICD-10-CM

## 2016-09-04 DIAGNOSIS — Z1231 Encounter for screening mammogram for malignant neoplasm of breast: Secondary | ICD-10-CM | POA: Diagnosis present

## 2016-09-04 NOTE — Patient Instructions (Signed)

## 2016-09-04 NOTE — Progress Notes (Signed)
   Subjective:     Judy Anderson is a 51 y.o. female and is here for a comprehensive physical exam. The patient reports no problems. Cycles have spaced.  Social History   Social History  . Marital status: Single    Spouse name: N/A  . Number of children: N/A  . Years of education: N/A   Occupational History  . teacher's assistant at [employer] History Main Topics  . Smoking status: Never Smoker  . Smokeless tobacco: Never Used  . Alcohol use No  . Drug use: No  . Sexual activity: Not Currently   Other Topics Concern  . Not on file   Social History Narrative   [employer] grad; finished her masters 2011.   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    Single   Health Maintenance  Topic Date Due  . MAMMOGRAM  11/26/2014  . PAP SMEAR  01/27/2016  . COLONOSCOPY  12/23/2018  . TETANUS/TDAP  03/25/2019  . INFLUENZA VACCINE  Completed  . HIV Screening  Addressed    The following portions of the patient's history were reviewed and updated as appropriate: allergies, current medications, past family history, past medical history, past social history, past surgical history and problem list.  Review of Systems Pertinent items noted in HPI and remainder of comprehensive ROS otherwise negative.   Objective:    BP 131/83 (BP Location: Left Arm, Patient Position: Sitting, Cuff Size: Large)   Pulse 60   Resp 18   Ht 5\' 2"  (1.575 m)   Wt 203 lb (92.1 kg)   LMP 05/21/2016   BMI 37.13 kg/m  General appearance: alert, cooperative and appears stated age Head: Normocephalic, without obvious abnormality, atraumatic Neck: no adenopathy, supple, symmetrical, trachea midline and thyroid not enlarged, symmetric, no tenderness/mass/nodules Lungs: clear to auscultation bilaterally Breasts: normal appearance, no masses or tenderness Heart: regular rate and rhythm, S1, S2 normal, no  murmur, click, rub or gallop Abdomen: soft, non-tender; bowel sounds normal; no masses,  no organomegaly Pelvic: cervix normal in appearance, external genitalia normal, no adnexal masses or tenderness, no cervical motion tenderness, uterus normal size, shape, and consistency and vagina normal without discharge Extremities: extremities normal, atraumatic, no cyanosis or edema Pulses: 2+ and symmetric Skin: Skin color, texture, turgor normal. No rashes or lesions Lymph nodes: Cervical, supraclavicular, and axillary nodes normal. Neurologic: Grossly normal    Assessment:    Healthy female exam.      Plan:      Problem List Items Addressed This Visit    None    Visit Diagnoses    Screening for malignant neoplasm of cervix    -  Primary   Relevant Orders   Cytology - PAP   Gynecologic exam normal          See After Visit Summary for Counseling Recommendations

## 2016-09-06 LAB — CYTOLOGY - PAP
DIAGNOSIS: NEGATIVE
HPV (WINDOPATH): NOT DETECTED

## 2017-01-30 ENCOUNTER — Telehealth: Payer: Self-pay

## 2017-01-30 ENCOUNTER — Telehealth: Payer: Self-pay | Admitting: Family Medicine

## 2017-01-30 ENCOUNTER — Emergency Department
Admission: EM | Admit: 2017-01-30 | Discharge: 2017-01-30 | Disposition: A | Discharge status: Home / Self Care | Payer: Managed Care, Other (non HMO) | Attending: Emergency Medicine | Admitting: Emergency Medicine

## 2017-01-30 ENCOUNTER — Encounter: Payer: Self-pay | Admitting: *Deleted

## 2017-01-30 ENCOUNTER — Ambulatory Visit: Payer: Self-pay | Admitting: Internal Medicine

## 2017-01-30 DIAGNOSIS — H81399 Other peripheral vertigo, unspecified ear: Secondary | ICD-10-CM | POA: Insufficient documentation

## 2017-01-30 DIAGNOSIS — R110 Nausea: Secondary | ICD-10-CM | POA: Diagnosis not present

## 2017-01-30 DIAGNOSIS — R42 Dizziness and giddiness: Secondary | ICD-10-CM | POA: Diagnosis present

## 2017-01-30 DIAGNOSIS — Z79899 Other long term (current) drug therapy: Secondary | ICD-10-CM | POA: Diagnosis not present

## 2017-01-30 LAB — URINALYSIS, COMPLETE (UACMP) WITH MICROSCOPIC
Bacteria, UA: NONE SEEN
Bilirubin Urine: NEGATIVE
GLUCOSE, UA: NEGATIVE mg/dL
Hgb urine dipstick: NEGATIVE
KETONES UR: NEGATIVE mg/dL
Nitrite: NEGATIVE
PH: 6 (ref 5.0–8.0)
Protein, ur: NEGATIVE mg/dL
SPECIFIC GRAVITY, URINE: 1.024 (ref 1.005–1.030)

## 2017-01-30 LAB — CBC
HCT: 39.4 % (ref 35.0–47.0)
Hemoglobin: 13.4 g/dL (ref 12.0–16.0)
MCH: 30.9 pg (ref 26.0–34.0)
MCHC: 34 g/dL (ref 32.0–36.0)
MCV: 90.9 fL (ref 80.0–100.0)
PLATELETS: 201 10*3/uL (ref 150–440)
RBC: 4.33 MIL/uL (ref 3.80–5.20)
RDW: 12.6 % (ref 11.5–14.5)
WBC: 5.4 10*3/uL (ref 3.6–11.0)

## 2017-01-30 LAB — BASIC METABOLIC PANEL
Anion gap: 8 (ref 5–15)
BUN: 21 mg/dL — AB (ref 6–20)
CALCIUM: 9.3 mg/dL (ref 8.9–10.3)
CHLORIDE: 107 mmol/L (ref 101–111)
CO2: 27 mmol/L (ref 22–32)
CREATININE: 0.79 mg/dL (ref 0.44–1.00)
GFR calc non Af Amer: >60 mL/min (ref >60)
Glucose, Bld: 108 mg/dL — ABNORMAL HIGH (ref 65–99)
Potassium: 3.5 mmol/L (ref 3.5–5.1)
Sodium: 142 mmol/L (ref 135–145)

## 2017-01-30 LAB — TROPONIN I: Troponin I: <0.03 ng/mL (ref <0.03)

## 2017-01-30 MED ORDER — MECLIZINE HCL 25 MG PO TABS: 25.0000 mg | ORAL_TABLET | Freq: Three times a day (TID) | ORAL | 0 refills | Status: DC | PRN

## 2017-01-30 MED ORDER — ONDANSETRON HCL 4 MG PO TABS: 4.0000 mg | ORAL_TABLET | Freq: Two times a day (BID) | ORAL | 0 refills | Status: DC | PRN

## 2017-01-30 MED ORDER — MECLIZINE HCL 25 MG PO TABS
25.0000 mg | ORAL_TABLET | Freq: Once | ORAL | Status: AC
  Administered 2017-01-30: 25 mg via ORAL
  Filled 2017-01-30: qty 1

## 2017-01-30 NOTE — ED Notes (Signed)
NAD noted at this time. Pt assisted to the bathroom by this RN. Pt tolerated well. Pt denies any further needs. Will continue to monitor of further patient needs.

## 2017-01-30 NOTE — ED Notes (Signed)
Pt resting in bed. NAD noted at this time. Pt states that she feels good and is questioning what she is waiting on to go home. MD notified at this time.

## 2017-01-30 NOTE — ED Notes (Signed)
NAD noted at time of D/C. Pt denies questions or concerns. Pt ambulatory to the lobby at this time.  

## 2017-01-30 NOTE — Discharge Instructions (Signed)
Return to the ER for new or worsening dizziness, constant dizziness, worsening weakness, or any other symptoms that concern you.  Follow up with your regular doctor.

## 2017-01-30 NOTE — Telephone Encounter (Signed)
[facility]  Patient Name: Judy Anderson  DOB: [birth year]    Initial Comment Caller states having weakness and is queasy, dizziness last night   Nurse Assessment  Nurse: Harlow Mares, RN, Suanne Marker Date/Time Eilene Ghazi Time): 01/30/2017 10:28:22 AM  Confirm and document reason for call. If symptomatic, describe symptoms. ---Caller states having weakness and is queasy, dizziness last night.  Does the patient have any new or worsening symptoms? ---Yes  Will a triage be completed? ---Yes  Related visit to physician within the last 2 weeks? ---No  Does the PT have any chronic conditions? (i.e. diabetes, asthma, etc.) ---Yes  List chronic conditions. ---hx concussion in 2015;  Is the patient pregnant or possibly pregnant? (Ask all females between the ages of 58-55) ---No  Is this a behavioral health or substance abuse call? ---No     Guidelines    Guideline Title Affirmed Question Affirmed Notes  Weakness (Generalized) and Fatigue [1] MODERATE weakness (i.e., interferes with work, school, normal activities) AND [2] cause unknown (Exceptions: weakness with acute minor illness, or weakness from poor fluid intake)    Final Disposition User   See Physician within 4 Hours (or PCP triage) Harlow Mares, RN, Rhonda    Comments  Scheduled caller with [place] [place] for today at 3pm in the [facility] office.   Referrals  REFERRED TO PCP OFFICE   Disagree/Comply: Comply

## 2017-01-30 NOTE — Telephone Encounter (Addendum)
Pt still feels weak and nauseated today; no dizziness now. Pt has numbness in rt hand that started this morning but pt has good grip in her hands. Pt has problem remembering some things this morning that she should know; some SOB on and off. no slurred speech. Off and on had episodes where could not walk straight for the last couple of weeks ;  Last night pt had dizziness for approx 2-5 mins. After laying down dizziness went away. After dizziness pt felt weak and went to bed early. No H/A,CP. Pt is going to [facility] ED for eval.

## 2017-01-30 NOTE — ED Triage Notes (Signed)
Pt to ED reporting sudden onset of dizziness and weakness last night. Pt reports the feeling was similar to when she had a concussion in 2015 but denies head trauma yesterday. Pt reports dizziness began upon standing and decreased after she sat back down but it continued throughout the night and into today. Pt denies hx of vertigo.   Pt also reports having a numbness in her right hand since yesterday./ No headache reported and no other neuro deficits noted. No weakness noted in right hand.

## 2017-01-30 NOTE — Telephone Encounter (Signed)
Patient called concussion hotline per Neurology. She had a head injury 2 years ago and started to experience dizziness and nausea yesterday. No recent incident. Patient notes that she backed a truck up to a dock at work and hit the [place] harder than usual. She continued work and when she got home started feeling dizzy and nauseous. Per a verbal from Dr. Tamala Julian, patient should follow up with her PCP and keep her appointment with Dr. Charlotte Crumb on the 31st. Patient voices understanding.

## 2017-01-30 NOTE — ED Provider Notes (Signed)
[facility] Emergency Department Provider Note ____________________________________________   First MD Initiated Contact with Patient 01/30/17 1631     (approximate)  I have reviewed the triage vital signs and the nursing notes.   HISTORY  Chief Complaint Dizziness and Nausea    HPI Judy Anderson is a 51 y.o. female Who presents with dizziness for 1 day, acute onset when she stood up rapidly, described as a spinning sensation, and associated with nausea but no vomiting. Patient states symptoms improved and today while she was at work sh still had some feeling of being wobbly and generalized weakness but no severe spinning. She reports having similar symptoms after a concussion a few years ago, but denies any trauma acutely. Patient denies associated chest pain, shortness of breath or palpitations. She denies ear pain or hearing loss.  No headache.  Past Medical History:  Diagnosis Date  . Allergic rhinitis   . Back pain    resolved with physical therapy-2010  . Blood in stool    hx of, colonoscopy in 2010, no cancer  . Concussion    went to [facility]   . Fibroids    per gyn clinic   . History of chicken pox   . Palpitations     Patient Active Problem List   Diagnosis Date Noted  . Food allergy 08/24/2016  . Edema 10/15/2014  . Advance care planning 06/27/2014  . B12 deficiency 06/27/2014  . Obesity 08/31/2013  . Routine general medical examination at a health care facility 01/26/2013  . Palpitations 07/25/2011    Past Surgical History:  Procedure Laterality Date  . UTERINE FIBROID SURGERY  06/01/10   Myomectomy    Prior to Admission medications   Medication Sig Start Date End Date Taking? Authorizing Provider  cholecalciferol (VITAMIN D) 1000 units tablet Take 1 tablet (1,000 Units total) by mouth daily. 08/03/16   Tonia Ghent, MD  cyclobenzaprine (FLEXERIL) 10 MG tablet Take 1 tablet (10 mg total) by mouth at bedtime as needed  for muscle spasms. 01/07/15   Bedsole, Amy E, MD  diclofenac (VOLTAREN) 75 MG EC tablet Take 1 tablet (75 mg total) by mouth 2 (two) times daily. 01/07/15   Bedsole, Amy E, MD  diltiazem (CARDIZEM) 30 MG tablet Take 1 tablet (30 mg total) by mouth 3 (three) times daily as needed. 08/31/13   Minna Merritts, MD  hydrochlorothiazide (HYDRODIURIL) 25 MG tablet Take 0.5-1 tablets (12.5-25 mg total) by mouth daily as needed (swelling). 10/14/14   Tonia Ghent, MD  meclizine (ANTIVERT) 25 MG tablet Take 1 tablet (25 mg total) by mouth 3 (three) times daily as needed for dizziness. 01/30/17   Arta Silence, MD  ondansetron (ZOFRAN) 4 MG tablet Take 1 tablet (4 mg total) by mouth 2 (two) times daily as needed for nausea or vomiting. 01/30/17   Arta Silence, MD    Allergies Penicillins  Family History  Problem Relation Age of Onset  . Adopted: Yes  . Hyperlipidemia Mother   . Hypertension Father   . Hyperlipidemia Father   . Heart disease Father        CABG  . Cancer Paternal Aunt        unknown primary  . Breast cancer Neg Hx        none known  . Colon cancer Neg Hx     Social History Social History  Substance Use Topics  . Smoking status: Never Smoker  . Smokeless tobacco: Never Used  .  Alcohol use No    Review of Systems  Constitutional: No fever/chills Eyes: No visual changes. ENT: No neck pain. Cardiovascular: Denies chest pain. Respiratory: Denies shortness of breath. Gastrointestinal: positive for nausea, negative for vomiting Genitourinary: Negative for dysuria.  Musculoskeletal: Negative for back pain. Skin: Negative for rash. Neurological: Negative for headaches, focal weakness or numbness. Positive for vertigo.   ____________________________________________   PHYSICAL EXAM:  VITAL SIGNS: ED Triage Vitals [01/30/17 1503]  Enc Vitals Group     BP (!) 117/36     Pulse Rate 66     Resp 16     Temp 98.2 F (36.8 C)     Temp Source Oral     SpO2  98 %     Weight 185 lb (83.9 kg)     Height 5' 2.5" (1.588 m)     Head Circumference      Peak Flow      Pain Score      Pain Loc      Pain Edu?      Excl. in [place]?     Constitutional: Alert and oriented. Well appearing and in no acute distress. Eyes: Conjunctivae are normal. EOMI. PERRLA. Head: Atraumatic. Nose: No congestion/rhinnorhea. Mouth/Throat: Mucous membranes are moist.  TMs appear normal. Neck: Normal range of motion.  Cardiovascular: Normal rate, regular rhythm. Grossly normal heart sounds.  Good peripheral circulation. Respiratory: Normal respiratory effort.  No retractions. Lungs CTAB. Gastrointestinal: Soft and nontender. No distention.  Genitourinary: No CVA tenderness. Musculoskeletal: No lower extremity edema.  Extremities warm and well perfused.  Neurologic:  Normal speech and language. No gross focal neurologic deficits are appreciated. Normal strength and intact sensation all extremities. Normal coordination and finger-to-nose. Cranial nerves III-XII intact. Skin:  Skin is warm and dry. No rash noted. Psychiatric: Mood and affect are normal. Speech and behavior are normal.  ____________________________________________   LABS (all labs ordered are listed, but only abnormal results are displayed)  Labs Reviewed  BASIC METABOLIC PANEL - Abnormal; Notable for the following:       Result Value   Glucose, Bld 108 (*)    BUN 21 (*)    All other components within normal limits  URINALYSIS, COMPLETE (UACMP) WITH MICROSCOPIC - Abnormal; Notable for the following:    Color, Urine YELLOW (*)    APPearance CLEAR (*)    Leukocytes, UA TRACE (*)    Squamous Epithelial / LPF 0-5 (*)    All other components within normal limits  CBC  TROPONIN I  CBG MONITORING, ED   ____________________________________________  EKG  ED ECG REPORT I, Arta Silence, the attending physician, personally viewed and interpreted this ECG.  Date: 01/30/2017 EKG Time: 15:03 Rate:  72 Rhythm: normal sinus rhythm QRS Axis: normal Intervals: normal ST/T Wave abnormalities: T-wave inversion in lead III Narrative Interpretation: unremarkable  ____________________________________________  RADIOLOGY    ____________________________________________   PROCEDURES  Procedure(s) performed: No    Critical Care performed: No ____________________________________________   INITIAL IMPRESSION / ASSESSMENT AND PLAN / ED COURSE  Pertinent labs & imaging results that were available during my care of the patient were reviewed by me and considered in my medical decision making (see chart for details).  51 year old female with no significant medical problems presents with dizziness described as spinning/vertigo since yesterday, of acute onset. Symptoms are worse with standing or walking. Also reports mild generalized weakness. Vital signs are normal, patient is well-appearing, and exam is unremarkable including completely normal neuro exam. Overall based  on acute onset and nature of symptoms suspect benign peripheral vertigo.given no headache, no trauma, normal neuro exam, and patient's there is no evidence for central vertigo or indication for brain imaging. Initial lab workup including cardiac enzymes is also negative.  Plan for symptomatic treatment with meclizine and likely discharge home.    ----------------------------------------- 6:48 PM on 01/30/2017 -----------------------------------------  Patient reports improved symptoms after meclizine. She continues to be well-appearing. Feels well to go home. Return precautions given. Will follow up with her PMD.  ____________________________________________   FINAL CLINICAL IMPRESSION(S) / ED DIAGNOSES  Final diagnoses:  Peripheral vertigo, unspecified laterality      NEW MEDICATIONS STARTED DURING THIS VISIT:  Discharge Medication List as of 01/30/2017  6:53 PM    START taking these medications   Details    meclizine (ANTIVERT) 25 MG tablet Take 1 tablet (25 mg total) by mouth 3 (three) times daily as needed for dizziness., Starting Wed 01/30/2017, Print    ondansetron (ZOFRAN) 4 MG tablet Take 1 tablet (4 mg total) by mouth 2 (two) times daily as needed for nausea or vomiting., Starting Wed 01/30/2017, Print         Note:  This document was prepared using Dragon voice recognition software and may include unintentional dictation errors.    Arta Silence, MD 01/30/17 2157

## 2017-01-30 NOTE — Telephone Encounter (Signed)
Can you call and get more info. This may not be appropriate for a 15 min slot.

## 2017-01-30 NOTE — Telephone Encounter (Signed)
Pt has appt on 01/30/17 at 3PM with Avie Echevaria NP.

## 2017-02-05 ENCOUNTER — Ambulatory Visit (INDEPENDENT_AMBULATORY_CARE_PROVIDER_SITE_OTHER): Payer: Managed Care, Other (non HMO) | Admitting: Family Medicine

## 2017-02-05 ENCOUNTER — Encounter: Payer: Self-pay | Admitting: Family Medicine

## 2017-02-05 VITALS — BP 102/60 | HR 60 | Temp 98.4°F | Wt 202.5 lb

## 2017-02-05 DIAGNOSIS — H8110 Benign paroxysmal vertigo, unspecified ear: Secondary | ICD-10-CM | POA: Diagnosis not present

## 2017-02-05 NOTE — Progress Notes (Signed)
She had been exercising, had been doing well at baseline.  She had been working on diet, taking fewer calories per meal but more meals during the day.    Then has sig vertigo sx, room spinning.  Seen at the ER.  Had nausea.  She has h/o concussion but this episode of vertigo didn't have proximate antecedent injury.  She had some vertigo after going to the ER.  Still with dec in appetite.    She hasn't been using HCTZ for edema, hasn't needed the med.    She has a chance of recurrent vertigo and may need time out of work intermittently and would need FMLA papers.  She can drop those off for [place] to work on.    Meds, vitals, and allergies reviewed.   ROS: Per HPI unless specifically indicated in ROS section   GEN: nad, alert and oriented HEENT: mucous membranes moist, TM wnl B OP wnl NECK: supple w/o LA CV: rrr.  no murmur PULM: ctab, no inc wob ABD: soft, +bs EXT: no edema SKIN: no acute rash CN 2-12 wnl B, S/S/DTR wnl x4

## 2017-02-05 NOTE — Patient Instructions (Addendum)
Please get me a copy of the FMLA papers.  Likely BPV.   Use meclizine if needed and use the bedside exercise.  Take care.  Glad to see you.  Drink enough fluid to keep from getting lightheaded.

## 2017-02-06 DIAGNOSIS — H8110 Benign paroxysmal vertigo, unspecified ear: Secondary | ICD-10-CM | POA: Insufficient documentation

## 2017-02-06 NOTE — Assessment & Plan Note (Signed)
Discussed with patient about BPV pathophysiology. The first episode she had recently was the worst. He came on quickly and lasted a few minutes. She has had intermittent recurrent symptoms since then, but they have not been of similar severity. She is gradually improving. She has onset of symptoms with position change. Discussed with patient about home bedside exercises, demonstrated. Use meclizine as needed. Update me as needed. She agrees. >25 minutes spent in face to face time with patient, >50% spent in counselling or coordination of care

## 2017-02-15 ENCOUNTER — Encounter: Payer: Self-pay | Admitting: Neurology

## 2017-02-15 ENCOUNTER — Ambulatory Visit (INDEPENDENT_AMBULATORY_CARE_PROVIDER_SITE_OTHER): Payer: Managed Care, Other (non HMO) | Admitting: Neurology

## 2017-02-15 VITALS — BP 116/76 | HR 62 | Ht 62.5 in | Wt 207.0 lb

## 2017-02-15 DIAGNOSIS — R202 Paresthesia of skin: Secondary | ICD-10-CM

## 2017-02-15 DIAGNOSIS — R42 Dizziness and giddiness: Secondary | ICD-10-CM | POA: Diagnosis not present

## 2017-02-15 DIAGNOSIS — R200 Anesthesia of skin: Secondary | ICD-10-CM

## 2017-02-15 NOTE — Progress Notes (Signed)
NEUROLOGY FOLLOW UP OFFICE NOTE  Judy Anderson [id number]  DOB Jun 24, 1965  HISTORY OF PRESENT ILLNESS: I had the pleasure of seeing Judy Anderson in follow-up in the neurology clinic on 02/15/2017.  The patient was last seen 2 years ago for post-concussion syndrome after slipping on ice in February 2015. She was reporting headaches, dizziness, cognitive changes, as well as intermittent numbness over the right side of her face, as well as over the right arm and leg. MRI brain and C-spine which were unremarkable, with mild spondylosis at C5-6 and C6-7. She started amitriptyline for post-concussive headaches with resolution of headaches. The headaches had resolved and she tapered off amitriptyline and was instructed to follow-up on an as needed basis.   She presents today after a transient episode of vertigo last 01/30/17. After she came home from work, she had a sensation of spinning where she had to lie down. It lasted around 3 minutes with associated nausea. She had a little pain and numbness in her right arm with the spinning sensation. She reports having numbness and dizziness intermittently for the past few weeks where she felt lightheaded and like she was veering off to one side. She went to the ER with note of BP 117/36, EKG normal, diagnosed with possible BPPV. She denies any headaches, diplopia, dysarthria/dysphagia, neck/back pain, bowel/bladder dysfunction. She has been working out recently.   HPI 10/23/2013: This is a very pleasant 51 yo RH woman with a history of tachycardia who was in her usual state of health until 08/05/2013 when she slipped on ice and fell on her back with her right leg extended. Since then, she had headaches, dizziness described as a floating sensation, nausea, tinnitus, and cognitive changes with slowed processing. These symptoms have gradually improved, however she reported continued daily dull headaches over the occipital and bilateral parietal regions, left more than  right. She has also been having recurrent episodes of numbness over the right side of her face with blurred vision on the right eye. Loud sounds on the right side, fatigue, and "intense conversations" trigger the numbness and tingling on the right side of her face. She has had to put cotton or tissue in her right ear due to this. Her mother has told her the right side of her face looks swollen at times. She has noticed bilateral proximal arm weakness and fatigability where she has a difficult time fixing her hair. When she fell she heard a pop in her neck and has had neck pain and shock-like sensations from the right wrist down her hand, as well as right-sided back pain with shock-like sensations going down her right leg. She has been taking prn acetaminophen for the leg pain. She has pain on the right elbow and occasional numbness on her right palm. She has to move and bend down slowly, otherwise she gets off balance.   PAST MEDICAL HISTORY: Past Medical History:  Diagnosis Date  . Allergic rhinitis   . Back pain    resolved with physical therapy-2010  . Blood in stool    hx of, colonoscopy in 2010, no cancer  . Concussion    went to [facility]   . Fibroids    per gyn clinic   . History of chicken pox   . Palpitations     MEDICATIONS: Current Outpatient Prescriptions on File Prior to Visit  Medication Sig Dispense Refill  . cholecalciferol (VITAMIN D) 1000 units tablet Take 1 tablet (1,000 Units total) by mouth daily.    Marland Kitchen  cyclobenzaprine (FLEXERIL) 10 MG tablet Take 1 tablet (10 mg total) by mouth at bedtime as needed for muscle spasms. (Patient not taking: Reported on 02/05/2017) 15 tablet 0  . diltiazem (CARDIZEM) 30 MG tablet Take 1 tablet (30 mg total) by mouth 3 (three) times daily as needed. (Patient not taking: Reported on 02/05/2017) 30 tablet 11  . hydrochlorothiazide (HYDRODIURIL) 25 MG tablet Take 0.5-1 tablets (12.5-25 mg total) by mouth daily as needed (swelling). (Patient  not taking: Reported on 02/05/2017) 30 tablet 1  . meclizine (ANTIVERT) 25 MG tablet Take 1 tablet (25 mg total) by mouth 3 (three) times daily as needed for dizziness. 15 tablet 0  . ondansetron (ZOFRAN) 4 MG tablet Take 1 tablet (4 mg total) by mouth 2 (two) times daily as needed for nausea or vomiting. 12 tablet 0  . [DISCONTINUED] albuterol (PROVENTIL HFA;VENTOLIN HFA) 108 (90 BASE) MCG/ACT inhaler Inhale 1-2 puffs into the lungs every 4 (four) hours as needed for wheezing (or for cough). 18 g 1   No current facility-administered medications on file prior to visit.     ALLERGIES: Allergies  Allergen Reactions  . Penicillins     REACTION: Throat swells    FAMILY HISTORY: Family History  Problem Relation Age of Onset  . Adopted: Yes  . Hyperlipidemia Mother   . Hypertension Father   . Hyperlipidemia Father   . Heart disease Father        CABG  . Cancer Paternal Aunt        unknown primary  . Breast cancer Neg Hx        none known  . Colon cancer Neg Hx     SOCIAL HISTORY: Social History   Social History  . Marital status: Single    Spouse name: N/A  . Number of children: N/A  . Years of education: N/A   Occupational History  . teacher's assistant at [employer] History Main Topics  . Smoking status: Never Smoker  . Smokeless tobacco: Never Used  . Alcohol use No  . Drug use: No  . Sexual activity: Not Currently   Other Topics Concern  . Not on file   Social History Narrative   [employer] grad; finished her masters 2011.   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    Single    REVIEW OF SYSTEMS: Constitutional: No fevers, chills, or sweats, no generalized fatigue, change in appetite Eyes: No visual changes, double vision, eye pain Ear, nose and throat: No hearing loss, ear pain, nasal congestion, sore throat Cardiovascular: No chest pain,  palpitations Respiratory:  No shortness of breath at rest or with exertion, wheezes GastrointestinaI: No nausea, vomiting, diarrhea, abdominal pain, fecal incontinence Genitourinary:  No dysuria, urinary retention or frequency Musculoskeletal:  No neck pain, back pain Integumentary: No rash, pruritus, skin lesions Neurological: as above Psychiatric: No depression, insomnia, anxiety Endocrine: No palpitations, fatigue, diaphoresis, mood swings, change in appetite, change in weight, increased thirst Hematologic/Lymphatic:  No anemia, purpura, petechiae. Allergic/Immunologic: no itchy/runny eyes, nasal congestion, recent allergic reactions, rashes  PHYSICAL EXAM: Vitals:   02/15/17 1508  BP: 116/76  Pulse: 62  SpO2: 98%   General: No acute distress Head:  Normocephalic/atraumatic Neck: supple, no paraspinal tenderness, full range of motion Heart:  Regular rate and rhythm Lungs:  Clear to auscultation bilaterally Back: No paraspinal tenderness Skin/Extremities: No rash, no edema  Neurological Exam: alert and oriented to person, place, and time. No aphasia or dysarthria. Fund of knowledge is appropriate.  Recent and remote memory are intact.  Attention and concentration are normal.    Able to name objects and repeat phrases. Cranial nerves: Pupils equal, round, reactive to light.  Fundoscopic exam unremarkable, no papilledema. Extraocular movements intact with no nystagmus. Visual fields full. Facial sensation intact. No facial asymmetry. Tongue, uvula, palate midline.  Motor: Bulk and tone normal, muscle strength 5/5 throughout with no pronator drift.  Sensation to light touch, temperature and vibration intact.  No extinction to double simultaneous stimulation.  Deep tendon reflexes 2+ throughout, toes downgoing.  Finger to nose testing intact.  Gait narrow-based and steady, able to tandem walk adequately.  Romberg negative.  IMPRESSION: This is a pleasant 51 yo RH woman with a history of  post-concussion syndrome after a fall in February 2015, doing well for 2 years off medication, presenting today after a transient episode of vertigo with right arm numbness. Her neurological exam today is normal. She reports recurrent episodes of dizziness and numbness recently. Etiology of symptoms is unclear, differential diagnosis is broad, including posterior circulation TIA, demyelination, or benign positional vertigo with some anxiety. MRI brain without contrast will be ordered to assess for underlying structural abnormality. MRA head without contrast will be ordered to assess for any intracranial stenosis. Lipid panel will be ordered. She knows to go to the ER if symptoms change or progress, and will follow-up in 6 months or earlier if needed.  Thank you for allowing me to participate in her care.  Please do not hesitate to call for any questions or concerns.  The duration of this appointment visit was 25 minutes of face-to-face time with the patient.  Greater than 50% of this time was spent in counseling, explanation of diagnosis, planning of further management, and coordination of care.   Ellouise Newer, M.D.   CC: Dr. Damita Dunnings

## 2017-02-15 NOTE — Patient Instructions (Signed)
1. Schedule MRI brain without contrast 2. Schedule MRA head without contrast 3. Bloodwork for fasting lipid panel 4. Our office will call you for results, follow-up in 6 months 5. If symptoms change and progress, go to ER immediately

## 2017-02-20 ENCOUNTER — Other Ambulatory Visit: Payer: Managed Care, Other (non HMO)

## 2017-02-20 ENCOUNTER — Telehealth: Payer: Self-pay | Admitting: Family Medicine

## 2017-02-20 DIAGNOSIS — R200 Anesthesia of skin: Secondary | ICD-10-CM

## 2017-02-20 DIAGNOSIS — R42 Dizziness and giddiness: Secondary | ICD-10-CM

## 2017-02-20 NOTE — Telephone Encounter (Signed)
I'll work on the hard copy.  Thanks.  

## 2017-02-20 NOTE — Telephone Encounter (Signed)
fmla paperwork in dr duncan's in box °For review and signature °

## 2017-02-20 NOTE — Telephone Encounter (Signed)
fmla   For vertigo Start date  8/15 through  8/21  intermittant for flare up for vertigo Dr appointment neurologist Dr Delice Lesch

## 2017-02-21 LAB — LIPID PANEL WITH LDL/HDL RATIO
Cholesterol, Total: 169 mg/dL (ref 100–199)
HDL: 66 mg/dL (ref >39)
LDL CALC: 87 mg/dL (ref 0–99)
LDl/HDL Ratio: 1.3 ratio (ref 0.0–3.2)
Triglycerides: 79 mg/dL (ref 0–149)
VLDL CHOLESTEROL CAL: 16 mg/dL (ref 5–40)

## 2017-02-22 DIAGNOSIS — R200 Anesthesia of skin: Secondary | ICD-10-CM | POA: Insufficient documentation

## 2017-02-22 DIAGNOSIS — R42 Dizziness and giddiness: Secondary | ICD-10-CM | POA: Insufficient documentation

## 2017-02-25 NOTE — Telephone Encounter (Signed)
Paperwork faxed Copy for pt Copy for file Copy for scan  Left message asking pt to call back

## 2017-02-28 ENCOUNTER — Ambulatory Visit
Admission: RE | Admit: 2017-02-28 | Discharge: 2017-02-28 | Disposition: A | Discharge status: Home / Self Care | Payer: Managed Care, Other (non HMO) | Source: Ambulatory Visit | Attending: Neurology | Admitting: Neurology

## 2017-02-28 ENCOUNTER — Telehealth: Payer: Self-pay

## 2017-02-28 DIAGNOSIS — R42 Dizziness and giddiness: Secondary | ICD-10-CM

## 2017-02-28 DIAGNOSIS — R200 Anesthesia of skin: Secondary | ICD-10-CM

## 2017-02-28 NOTE — Telephone Encounter (Signed)
-----   Message from Cameron Sprang, MD sent at 02/28/2017  8:48 AM EDT ----- Pls let her know cholesterol level looks good, continue with exercise and diet control, thanks

## 2017-02-28 NOTE — Telephone Encounter (Signed)
[facility] faxed paperwork back need question 7 filled out.  After you put in times  Please initial and date  In dr Damita Dunnings in box

## 2017-02-28 NOTE — Telephone Encounter (Signed)
Spoke with pt, relaying message below. 

## 2017-02-28 NOTE — Telephone Encounter (Signed)
Done. Thanks.

## 2017-03-04 NOTE — Telephone Encounter (Signed)
Paperwork faxed °

## 2017-03-05 NOTE — Telephone Encounter (Signed)
Copy for pt Copy for file Copy for scan

## 2017-03-14 ENCOUNTER — Ambulatory Visit (INDEPENDENT_AMBULATORY_CARE_PROVIDER_SITE_OTHER): Payer: Managed Care, Other (non HMO)

## 2017-03-14 DIAGNOSIS — Z23 Encounter for immunization: Secondary | ICD-10-CM

## 2017-03-15 ENCOUNTER — Telehealth: Payer: Self-pay

## 2017-03-15 NOTE — Telephone Encounter (Signed)
Pt left v/m requesting immunization record where got flu shot faxed; I left v/m that I cannot fax but can either put at front desk for pick up or mail to verified home address.

## 2017-03-19 ENCOUNTER — Other Ambulatory Visit: Payer: Self-pay

## 2017-03-21 NOTE — Telephone Encounter (Signed)
Patient wants to come by and pick it up tomorrow morning.

## 2017-03-22 NOTE — Telephone Encounter (Signed)
At front desk ready for pickup.

## 2017-04-04 ENCOUNTER — Encounter: Payer: Self-pay | Admitting: Family Medicine

## 2017-04-26 ENCOUNTER — Encounter: Payer: Self-pay | Admitting: Family Medicine

## 2017-04-26 ENCOUNTER — Ambulatory Visit (INDEPENDENT_AMBULATORY_CARE_PROVIDER_SITE_OTHER): Payer: Managed Care, Other (non HMO) | Admitting: Family Medicine

## 2017-04-26 ENCOUNTER — Ambulatory Visit: Payer: Self-pay | Admitting: *Deleted

## 2017-04-26 VITALS — BP 120/80 | HR 58 | Temp 98.0°F | Wt 201.4 lb

## 2017-04-26 DIAGNOSIS — K642 Third degree hemorrhoids: Secondary | ICD-10-CM

## 2017-04-26 LAB — CBC WITH DIFFERENTIAL/PLATELET
BASOS PCT: 0.7 % (ref 0.0–3.0)
Basophils Absolute: 0 10*3/uL (ref 0.0–0.1)
EOS ABS: 0.1 10*3/uL (ref 0.0–0.7)
Eosinophils Relative: 1.1 % (ref 0.0–5.0)
HCT: 41.8 % (ref 36.0–46.0)
HEMOGLOBIN: 13.8 g/dL (ref 12.0–15.0)
LYMPHS PCT: 22.6 % (ref 12.0–46.0)
Lymphs Abs: 1.1 10*3/uL (ref 0.7–4.0)
MCHC: 33 g/dL (ref 30.0–36.0)
MCV: 93.1 fl (ref 78.0–100.0)
Monocytes Absolute: 0.3 10*3/uL (ref 0.1–1.0)
Monocytes Relative: 6.2 % (ref 3.0–12.0)
Neutro Abs: 3.5 10*3/uL (ref 1.4–7.7)
Neutrophils Relative %: 69.4 % (ref 43.0–77.0)
Platelets: 212 10*3/uL (ref 150.0–400.0)
RBC: 4.49 Mil/uL (ref 3.87–5.11)
RDW: 12.4 % (ref 11.5–15.5)
WBC: 5 10*3/uL (ref 4.0–10.5)

## 2017-04-26 MED ORDER — HYDROCORTISONE 2.5 % RE CREA: 1.0000 "application " | TOPICAL_CREAM | Freq: Two times a day (BID) | RECTAL | 0 refills | Status: AC

## 2017-04-26 NOTE — Telephone Encounter (Signed)
Spoke with Malinda from [facility] building and patient had been scheduled to come in at 2pm and provider is aware. Nothing further needed

## 2017-04-26 NOTE — Progress Notes (Signed)
Subjective:    Patient ID: Judy Anderson, female    DOB: 04-16-1966, 51 y.o.   MRN: [id number]  Chief Complaint  Patient presents with  . Hemorrhoids    HPI Patient was seen today for acute concern.  Patient endorses hemorrhoids with active bleeding times 3 days.  Patient noticed BRB on toilet paper and on outside of stool.  Patient endorses feeling dizzy last week, decreased appetite times 2 weeks, vertigo last week and overall feeling tired/weakness.  Patient denies stool being darker in color, constipation, palpitations.  Patient has not tried anything for her symptoms.  Past Medical History:  Diagnosis Date  . Allergic rhinitis   . Back pain    resolved with physical therapy-2010  . Blood in stool    hx of, colonoscopy in 2010, no cancer  . Concussion    went to [facility]   . Fibroids    per gyn clinic   . History of chicken pox   . Palpitations     Allergies  Allergen Reactions  . Penicillins     REACTION: Throat swells    ROS General: Denies fever, chills, night sweats, changes in weight, changes in appetite HEENT: Denies headaches, ear pain, changes in vision, rhinorrhea, sore throat CV: Denies CP, palpitations, SOB, orthopnea Pulm: Denies SOB, cough, wheezing GI: Denies abdominal pain, nausea, vomiting, diarrhea, constipation  +hemorrhoids, blood noted when wiping. GU: Denies dysuria, hematuria, frequency, vaginal discharge Msk: Denies muscle cramps, joint pains Neuro: Denies weakness, numbness, tingling Skin: Denies rashes, bruising Psych: Denies depression, anxiety, hallucinations     Objective:    Blood pressure 120/80, pulse (!) 58, temperature 98 F (36.7 C), temperature source Oral, weight 201 lb 6.4 oz (91.4 kg).   Gen. Pleasant, well-nourished, in no distress, normal affect  HEENT: [place]/AT, face symmetric, no scleral icterus, PERRLA, nares patent without drainage Lungs: no accessory muscle use, CTAB, no wheezes or rales Cardiovascular: RRR, no  m/r/g, no peripheral edema Abdomen: BS present, soft, NT/ND Neuro:  A&Ox3, CN II-XII intact, normal gait GU: Anus with external hemorrhoids present, not bleeding, not thrombosed.  Internal sphincter with normal tone, no stool in rectal vault.  No overt bleeding noted on rectal exam.  Stool guaiac positive.   Wt Readings from Last 3 Encounters:  04/26/17 201 lb 6.4 oz (91.4 kg)  02/15/17 207 lb (93.9 kg)  02/05/17 202 lb 8 oz (91.9 kg)    Lab Results  Component Value Date   WBC 5.4 01/30/2017   HGB 13.4 01/30/2017   HCT 39.4 01/30/2017   PLT 201 01/30/2017   GLUCOSE 108 (H) 01/30/2017   CHOL 169 02/20/2017   TRIG 79 02/20/2017   HDL 66 02/20/2017   LDLCALC 87 02/20/2017   ALT 17 07/25/2011   AST 20 07/25/2011   NA 142 01/30/2017   K 3.5 01/30/2017   CL 107 01/30/2017   CREATININE 0.79 01/30/2017   BUN 21 (H) 01/30/2017   CO2 27 01/30/2017   TSH 0.45 07/25/2011    Assessment/Plan:  Grade III hemorrhoids - Plan: CBC with Differential/Platelet   -Stool guaiac positive -Will obtain stat CBC -Given Rx for Anusol -Patient advised may need to proceed to ED if hemoglobin is low, patient continues to feel dizzy, or has continued bleeding. -Given RTC precautions -Verified patient info i.e. phone number prior to her departure from clinic -If problem continues may benefit from GI consult.

## 2017-04-26 NOTE — Telephone Encounter (Signed)
Has external hemrroids .  Reason for Disposition . [1] MODERATE rectal bleeding (small blood clots, passing blood without stool, or toilet water turns red) AND [2] more than once a day  Answer Assessment - Initial Assessment Questions 1. APPEARANCE of BLOOD: "What color is it?" "Is it passed separately, on the surface of the stool, or mixed in with the stool?"      Mixed in stool 2. AMOUNT: "How much blood was passed?"      Quarter sized amount of blood each stool 3. FREQUENCY: "How many times has blood been passed with the stools?"     2-3 stools over the last few days 4. ONSET: "When was the blood first seen in the stools?" (Days or weeks)      Tuesday night 5. DIARRHEA: "Is there also some diarrhea?" If so, ask: "How many diarrhea stools were passed in past 24 hours?"     no 6. CONSTIPATION: "Do you have constipation?" If so, "How bad is it?"    no 7. RECURRENT SYMPTOMS: "Have you had blood in your stools before?" If so, ask: "When was the last time?" and "What happened that time?"    Yes with hemrroids and used prescription cream. Have not used any this week. 8. BLOOD THINNERS: "Do you take any blood thinners?" (e.g., Coumadin/warfarin, Pradaxa/dabigatran, aspirin)     none 9. OTHER SYMPTOMS: "Do you have any other symptoms?"  (e.g., abdominal pain, vomiting, dizziness, fever)     Skin is reallly dry and colorless, very tired and dealing with a lot of stress, vertigo last week, not eating well and some nausea 10. PREGNANCY: "Is there any chance you are pregnant?" "When was your last menstrual period?"      no  Protocols used: RECTAL BLEEDING-A-AH

## 2017-04-26 NOTE — Patient Instructions (Addendum)
Hemorrhoids Hemorrhoids are swollen veins in and around the rectum or anus. There are two types of hemorrhoids:  Internal hemorrhoids. These occur in the veins that are just inside the rectum. They may poke through to the outside and become irritated and painful.  External hemorrhoids. These occur in the veins that are outside of the anus and can be felt as a painful swelling or hard lump near the anus.  Most hemorrhoids do not cause serious problems, and they can be managed with home treatments such as diet and lifestyle changes. If home treatments do not help your symptoms, procedures can be done to shrink or remove the hemorrhoids. What are the causes? This condition is caused by increased pressure in the anal area. This pressure may result from various things, including:  Constipation.  Straining to have a bowel movement.  Diarrhea.  Pregnancy.  Obesity.  Sitting for long periods of time.  Heavy lifting or other activity that causes you to strain.  Anal sex.  What are the signs or symptoms? Symptoms of this condition include:  Pain.  Anal itching or irritation.  Rectal bleeding.  Leakage of stool (feces).  Anal swelling.  One or more lumps around the anus.  How is this diagnosed? This condition can often be diagnosed through a visual exam. Other exams or tests may also be done, such as:  Examination of the rectal area with a gloved hand (digital rectal exam).  Examination of the anal canal using a small tube (anoscope).  A blood test, if you have lost a significant amount of blood.  A test to look inside the colon (sigmoidoscopy or colonoscopy).  How is this treated? This condition can usually be treated at home. However, various procedures may be done if dietary changes, lifestyle changes, and other home treatments do not help your symptoms. These procedures can help make the hemorrhoids smaller or remove them completely. Some of these procedures involve  surgery, and others do not. Common procedures include:  Rubber band ligation. Rubber bands are placed at the base of the hemorrhoids to cut off the blood supply to them.  Sclerotherapy. Medicine is injected into the hemorrhoids to shrink them.  Infrared coagulation. A type of light energy is used to get rid of the hemorrhoids.  Hemorrhoidectomy surgery. The hemorrhoids are surgically removed, and the veins that supply them are tied off.  Stapled hemorrhoidopexy surgery. A circular stapling device is used to remove the hemorrhoids and use staples to cut off the blood supply to them.  Follow these instructions at home: Eating and drinking  Eat foods that have a lot of fiber in them, such as whole grains, beans, nuts, fruits, and vegetables. Ask your health care provider about taking products that have added fiber (fiber supplements).  Drink enough fluid to keep your urine clear or pale yellow. Managing pain and swelling  Take warm sitz baths for 20 minutes, 3-4 times a day to ease pain and discomfort.  If directed, apply ice to the affected area. Using ice packs between sitz baths may be helpful. ? Put ice in a plastic bag. ? Place a towel between your skin and the bag. ? Leave the ice on for 20 minutes, 2-3 times a day. General instructions  Take over-the-counter and prescription medicines only as told by your health care provider.  Use medicated creams or suppositories as told.  Exercise regularly.  Go to the bathroom when you have the urge to have a bowel movement. Do not wait.    Avoid straining to have bowel movements.  Keep the anal area dry and clean. Use wet toilet paper or moist towelettes after a bowel movement.  Do not sit on the toilet for long periods of time. This increases blood pooling and pain. Contact a health care provider if:  You have increasing pain and swelling that are not controlled by treatment or medicine.  You have uncontrolled bleeding.  You  have difficulty having a bowel movement, or you are unable to have a bowel movement.  You have pain or inflammation outside the area of the hemorrhoids. This information is not intended to replace advice given to you by your health care provider. Make sure you discuss any questions you have with your health care provider. Document Released: 06/01/2000 Document Revised: 11/02/2015 Document Reviewed: 02/16/2015 Elsevier Interactive Patient Education  2017 [employer]  

## 2017-04-29 ENCOUNTER — Encounter: Payer: Self-pay | Admitting: Medical

## 2017-04-29 ENCOUNTER — Ambulatory Visit (INDEPENDENT_AMBULATORY_CARE_PROVIDER_SITE_OTHER): Payer: Managed Care, Other (non HMO) | Admitting: Medical

## 2017-04-29 ENCOUNTER — Inpatient Hospital Stay (HOSPITAL_COMMUNITY)
Admission: AD | Admit: 2017-04-29 | Discharge: 2017-04-29 | Disposition: A | Discharge status: Home / Self Care | Payer: Managed Care, Other (non HMO) | Source: Ambulatory Visit | Attending: Family Medicine | Admitting: Family Medicine

## 2017-04-29 VITALS — BP 125/83 | HR 82 | Wt 202.0 lb

## 2017-04-29 DIAGNOSIS — K649 Unspecified hemorrhoids: Secondary | ICD-10-CM

## 2017-04-29 MED ORDER — PHENYLEPH-SHARK LIV OIL-MO-PET 0.25-3-14-71.9 % RE OINT: 1.0000 "application " | TOPICAL_OINTMENT | Freq: Two times a day (BID) | RECTAL | 0 refills | Status: AC | PRN

## 2017-04-29 MED ORDER — WITCH HAZEL-GLYCERIN EX PADS: 1.0000 "application " | MEDICATED_PAD | CUTANEOUS | 12 refills | Status: AC | PRN

## 2017-04-29 MED ORDER — DOCUSATE SODIUM 250 MG PO CAPS
250.0000 mg | ORAL_CAPSULE | Freq: Every day | ORAL | 0 refills | Status: AC
Start: 2017-04-29 — End: ?

## 2017-04-29 NOTE — MAU Provider Note (Signed)
Patient here with complaints of hemorrhoids. No other complaints. Medical  Screen done. Patient was offered a visit down in the walk in clinic now. Patient agreeable to go to the [place] walk in clinic at this time.   Kerry Hough [facility] notified of patient coming  Lezlie Lye, NP 04/29/2017 7:31 PM

## 2017-04-29 NOTE — Patient Instructions (Signed)
Witch Hazel topical solution wipes What is this medicine? WITCH HAZEL [facility] hey zuhl) is a botanical astringent from the plant [facility]. The wipes and pads are used to relieve itching, burning, and irritation caused by hemorrhoids or bowel movements. They may also be used to clean the outer vaginal area after childbirth or the rectal area following rectal surgery. This medicine may be used for other purposes; ask your health care provider or pharmacist if you have questions. COMMON BRAND NAME(S): Hemorrhoidal, Medi-Pads, Preparation H Maximum Strength, Preparation H Totables, Sani-Pads with Aloe, Tucks What should I tell my health care provider before I take this medicine? They need to know if you have any of these conditions: -bleeding in the treated area -an unusual or allergic reaction to witch hazel, other medicines, foods, dyes, or preservatives How should I use this medicine? This medicine is for external use only. Follow the directions on the package label or the advice of your doctor or health care professional. Do not use your medicine more often than directed. Talk to your pediatrician regarding the use of this medicine in children. While this drug may be used for children as young as 12 years for selected conditions, precautions do apply. Overdosage: If you think you have taken too much of this medicine contact a poison control center or emergency room at once. NOTE: This medicine is only for you. Do not share this medicine with others. What if I miss a dose? This does not apply; the pads or wipes may be used as needed, up to 6 times per day. What may interact with this medicine? Interactions are not expected. Do not use any other skin products on the same area of skin without asking your doctor or health care professional. This list may not describe all possible interactions. Give your health care provider a list of all the medicines, herbs, non-prescription drugs, or  dietary supplements you use. Also tell them if you smoke, drink alcohol, or use illegal drugs. Some items may interact with your medicine. What should I watch for while using this medicine? Tell your doctor or healthcare professional if your symptoms do not start to get better or if they get worse. Many pads and wipes are safe for septic and sewer system disposal. Check specific product label. What side effects may I notice from receiving this medicine? Side effects that you should report to your doctor or health care professional as soon as possible: -allergic reactions like skin rash, itching or hives, swelling of the face, lips, or tongue Side effects that usually do not require medical attention (report to your doctor or health care professional if they continue or are bothersome): -mild skin dryness or irritation This list may not describe all possible side effects. Call your doctor for medical advice about side effects. You may report side effects to FDA at 1-800-FDA-1088. Where should I keep my medicine? Keep out of the reach of children. Store at room temperature between 15 and 30 degrees C (59 and 86 degrees F). Throw away any unused medicine after the expiration date. NOTE: This sheet is a summary. It may not cover all possible information. If you have questions about this medicine, talk to your doctor, pharmacist, or health care provider.  2018 Elsevier/Gold Standard (2015-07-07 08:48:35) Hemorrhoids Hemorrhoids are swollen veins in and around the rectum or anus. Hemorrhoids can cause pain, itching, or bleeding. Most of the time, they do not cause serious problems. They usually get better with diet changes, lifestyle changes,  and other home treatments. Follow these instructions at home: Eating and drinking  Eat foods that have fiber, such as whole grains, beans, nuts, fruits, and vegetables. Ask your doctor about taking products that have added fiber (fibersupplements).  Drink  enough fluid to keep your pee (urine) clear or pale yellow. For Pain and Swelling  Take a warm-water bath (sitz bath) for 20 minutes to ease pain. Do this 3-4 times a day.  If directed, put ice on the painful area. It may be helpful to use ice between your warm baths. ? Put ice in a plastic bag. ? Place a towel between your skin and the bag. ? Leave the ice on for 20 minutes, 2-3 times a day. General instructions  Take over-the-counter and prescription medicines only as told by your doctor. ? Medicated creams and medicines that are inserted into the anus (suppositories) may be used or applied as told.  Exercise often.  Go to the bathroom when you have the urge to poop (to have a bowel movement). Do not wait.  Avoid pushing too hard (straining) when you poop.  Keep the butt area dry and clean. Use wet toilet paper or moist paper towels.  Do not sit on the toilet for a long time. Contact a doctor if:  You have any of these: ? Pain and swelling that do not get better with treatment or medicine. ? Bleeding that will not stop. ? Trouble pooping or you cannot poop. ? Pain or swelling outside the area of the hemorrhoids. This information is not intended to replace advice given to you by your health care provider. Make sure you discuss any questions you have with your health care provider. Document Released: 03/13/2008 Document Revised: 11/10/2015 Document Reviewed: 02/16/2015 Elsevier Interactive Patient Education  [employer].

## 2017-04-29 NOTE — Progress Notes (Signed)
  History:  Ms. Judy Anderson is a 51 y.o. [id number] who presents to clinic today for bleeding hemorrhoids. First noted hemorrhoids last week. May have had mild constipation at that time. Since then no issues with constipation. She takes a colon care supplement and has a high fiber diet and appropriate water intake.  She feels that she has had more frequent stools recently, but they have been soft and easy to pass. Last BM was today. She noted blood with wiping this week and today she saw blood in the toilet twice. She saw her PCP last week who did a hemoccult test and confirmed blood in the stool. She was given hydrocortisone cream at that time which she has been using. She called her PCP at [facility] after the second episode of blood in the toilet and was told to be seen tonight.   The following portions of the patient's history were reviewed and updated as appropriate: allergies, current medications, family history, past medical history, social history, past surgical history and problem list.  Review of Systems:  Review of Systems  Constitutional: Negative for fever and malaise/fatigue.  Gastrointestinal: Positive for blood in stool. Negative for constipation, diarrhea and melena.  Neurological: Negative for weakness.      Objective:  Physical Exam BP 125/83 (BP Location: Right Arm, Patient Position: Sitting, Cuff Size: Normal)   Pulse 82   Wt 202 lb (91.6 kg)   BMI 36.36 kg/m  Physical Exam  Constitutional: She is oriented to person, place, and time. She appears well-developed and well-nourished. No distress.  HENT:  Head: Normocephalic.  Cardiovascular: Normal rate.  Pulmonary/Chest: Effort normal.  Abdominal: Soft.  Genitourinary: Rectal exam shows external hemorrhoid (scant blood, no active bleeding) and tenderness.  Neurological: She is alert and oriented to person, place, and time.  Skin: Skin is warm and dry. No erythema.  Psychiatric: She has a normal mood and  affect.  Vitals reviewed.   Assessment & Plan:  1. Hemorrhoids, unspecified hemorrhoid type - docusate sodium (COLACE) 250 MG capsule; Take 1 capsule (250 mg total) daily by mouth.  Dispense: 10 capsule; Refill: 0 - witch hazel-glycerin (TUCKS) pad; Apply 1 application as needed topically for itching.  Dispense: 40 each; Refill: 12 - phenylephrine-shark liver oil-mineral oil-petrolatum (PREPARATION H) 0.25-3-14-71.9 % rectal ointment; Place 1 application 2 (two) times daily as needed rectally for hemorrhoids.  Dispense: 30 g; Refill: 0 - Continue increased water intake and high fiber diet - Follow-up with PCP if symptoms persist or worsen   Danielle Rankin 04/29/2017 7:54 PM

## 2017-05-30 ENCOUNTER — Telehealth: Payer: Self-pay

## 2017-05-30 ENCOUNTER — Ambulatory Visit: Payer: Managed Care, Other (non HMO)

## 2017-05-30 NOTE — Telephone Encounter (Signed)
Tried to call pt but mailbox was full. She is on the Nurse schedule for 330 today for a "TB Shot". If she is needing a PPD or TB Skin Test, it cannot be done on a Thursday due to the time frame that it needs to be read.   CRM [phone] created

## 2017-05-31 ENCOUNTER — Telehealth: Payer: Self-pay

## 2017-05-31 ENCOUNTER — Ambulatory Visit (INDEPENDENT_AMBULATORY_CARE_PROVIDER_SITE_OTHER): Payer: Managed Care, Other (non HMO)

## 2017-05-31 DIAGNOSIS — Z111 Encounter for screening for respiratory tuberculosis: Secondary | ICD-10-CM | POA: Diagnosis not present

## 2017-05-31 NOTE — Telephone Encounter (Signed)
I'll work on the hard copy.  Thanks.  

## 2017-05-31 NOTE — Telephone Encounter (Signed)
Pt came in for PPD Placement this morning and brought paperwork for Judy Anderson to be filled out and signed. Last CPE 08-03-16. Paperwork given to Dr Damita Dunnings to complete

## 2017-06-03 LAB — TB SKIN TEST: TB SKIN TEST: NEGATIVE

## 2017-06-03 NOTE — Telephone Encounter (Signed)
Patient came for PPD reading, PPW given to patient and scanned.

## 2017-08-20 ENCOUNTER — Ambulatory Visit (INDEPENDENT_AMBULATORY_CARE_PROVIDER_SITE_OTHER): Payer: Managed Care, Other (non HMO) | Admitting: Neurology

## 2017-08-20 ENCOUNTER — Encounter: Payer: Self-pay | Admitting: Neurology

## 2017-08-20 VITALS — BP 118/76 | HR 92 | Ht 62.5 in | Wt 205.0 lb

## 2017-08-20 DIAGNOSIS — R42 Dizziness and giddiness: Secondary | ICD-10-CM

## 2017-08-20 NOTE — Patient Instructions (Signed)
Looking good! Continue with control of blood pressure, cholesterol, sugar levels. Follow-up as needed, contact our office for any changes.

## 2017-08-20 NOTE — Progress Notes (Signed)
NEUROLOGY FOLLOW UP OFFICE NOTE  Judy Anderson [id number]  DOB 02/21/1966  HISTORY OF PRESENT ILLNESS: I had the pleasure of seeing Judy Anderson in follow-up in the neurology clinic on 08/20/2017.  The patient was last seen 6 months ago after she presented with vertigo with pain and numbness in the right arm. She went to the ER and was diagnosed with possible BPPV. She had an MRI/MRA brain which was normal. Lipid panel was normal, LDL 87. She reports doing well with no further dizziness or right arm numbness. She had a mild headache related to stress one time. Otherwise she has been doing very well. She denies any falls.   HPI 10/23/2013: This is a very pleasant 52 yo RH woman with a history of tachycardia who was in her usual state of health until 08/05/2013 when she slipped on ice and fell on her back with her right leg extended. Since then, she had headaches, dizziness described as a floating sensation, nausea, tinnitus, and cognitive changes with slowed processing. These symptoms have gradually improved, however she reported continued daily dull headaches over the occipital and bilateral parietal regions, left more than right. She has also been having recurrent episodes of numbness over the right side of her face with blurred vision on the right eye. Loud sounds on the right side, fatigue, and "intense conversations" trigger the numbness and tingling on the right side of her face. She has had to put cotton or tissue in her right ear due to this. Her mother has told her the right side of her face looks swollen at times. She has noticed bilateral proximal arm weakness and fatigability where she has a difficult time fixing her hair. When she fell she heard a pop in her neck and has had neck pain and shock-like sensations from the right wrist down her hand, as well as right-sided back pain with shock-like sensations going down her right leg. She has been taking prn acetaminophen for the leg pain. She has  pain on the right elbow and occasional numbness on her right palm. She has to move and bend down slowly, otherwise she gets off balance.   PAST MEDICAL HISTORY: Past Medical History:  Diagnosis Date  . Allergic rhinitis   . Back pain    resolved with physical therapy-2010  . Blood in stool    hx of, colonoscopy in 2010, no cancer  . Concussion    went to [facility]   . Fibroids    per gyn clinic   . History of chicken pox   . Palpitations     MEDICATIONS: Current Outpatient Medications on File Prior to Visit  Medication Sig Dispense Refill  . cholecalciferol (VITAMIN D) 1000 units tablet Take 1 tablet (1,000 Units total) by mouth daily.    . cyclobenzaprine (FLEXERIL) 10 MG tablet Take 1 tablet (10 mg total) by mouth at bedtime as needed for muscle spasms. 15 tablet 0  . diltiazem (CARDIZEM) 30 MG tablet Take 1 tablet (30 mg total) by mouth 3 (three) times daily as needed. 30 tablet 11  . docusate sodium (COLACE) 250 MG capsule Take 1 capsule (250 mg total) daily by mouth. 10 capsule 0  . hydrochlorothiazide (HYDRODIURIL) 25 MG tablet Take 0.5-1 tablets (12.5-25 mg total) by mouth daily as needed (swelling). 30 tablet 1  . meclizine (ANTIVERT) 25 MG tablet Take 1 tablet (25 mg total) by mouth 3 (three) times daily as needed for dizziness. 15 tablet 0  . ondansetron (ZOFRAN)  4 MG tablet Take 1 tablet (4 mg total) by mouth 2 (two) times daily as needed for nausea or vomiting. 12 tablet 0  . phenylephrine-shark liver oil-mineral oil-petrolatum (PREPARATION H) 0.25-3-14-71.9 % rectal ointment Place 1 application 2 (two) times daily as needed rectally for hemorrhoids. 30 g 0  . witch hazel-glycerin (TUCKS) pad Apply 1 application as needed topically for itching. 40 each 12  . [DISCONTINUED] albuterol (PROVENTIL HFA;VENTOLIN HFA) 108 (90 BASE) MCG/ACT inhaler Inhale 1-2 puffs into the lungs every 4 (four) hours as needed for wheezing (or for cough). 18 g 1   No current  facility-administered medications on file prior to visit.     ALLERGIES: Allergies  Allergen Reactions  . Penicillins     REACTION: Throat swells    FAMILY HISTORY: Family History  Adopted: Yes  Problem Relation Age of Onset  . Hyperlipidemia Mother   . Hypertension Father   . Hyperlipidemia Father   . Heart disease Father        CABG  . Cancer Paternal Aunt        unknown primary  . Breast cancer Neg Hx        none known  . Colon cancer Neg Hx     SOCIAL HISTORY: Social History   Socioeconomic History  . Marital status: Single    Spouse name: Not on file  . Number of children: Not on file  . Years of education: Not on file  . Highest education level: Not on file  Social Needs  . Financial resource strain: Not on file  . Food insecurity - worry: Not on file  . Food insecurity - inability: Not on file  . Transportation needs - medical: Not on file  . Transportation needs - non-medical: Not on file  Occupational History  . Occupation: Optometrist at [employer]  . Smoking status: Never Smoker  . Smokeless tobacco: Never Used  Substance and Sexual Activity  . Alcohol use: No    Alcohol/week: 0.0 oz  . Drug use: No  . Sexual activity: Not Currently  Other Topics Concern  . Not on file  Social History Narrative   [employer] grad; finished her masters 2011.   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    Single    REVIEW OF SYSTEMS: Constitutional: No fevers, chills, or sweats, no generalized fatigue, change in appetite Eyes: No visual changes, double vision, eye pain Ear, nose and throat: No hearing loss, ear pain, nasal congestion, sore throat Cardiovascular: No chest pain, palpitations Respiratory:  No shortness of breath at rest or with exertion, wheezes GastrointestinaI: No nausea, vomiting, diarrhea, abdominal pain, fecal  incontinence Genitourinary:  No dysuria, urinary retention or frequency Musculoskeletal:  No neck pain, back pain Integumentary: No rash, pruritus, skin lesions Neurological: as above Psychiatric: No depression, insomnia, anxiety Endocrine: No palpitations, fatigue, diaphoresis, mood swings, change in appetite, change in weight, increased thirst Hematologic/Lymphatic:  No anemia, purpura, petechiae. Allergic/Immunologic: no itchy/runny eyes, nasal congestion, recent allergic reactions, rashes  PHYSICAL EXAM: Vitals:   08/20/17 1617  BP: 118/76  Pulse: 92  SpO2: 97%   General: No acute distress Head:  Normocephalic/atraumatic Neck: supple, no paraspinal tenderness, full range of motion Heart:  Regular rate and rhythm Lungs:  Clear to auscultation bilaterally Back: No paraspinal tenderness Skin/Extremities: No rash, no edema Neurological Exam: alert and oriented to person, place,  and time. No aphasia or dysarthria. Fund of knowledge is appropriate.  Recent and remote memory are intact.  Attention and concentration are normal.    Able to name objects and repeat phrases. Cranial nerves: Pupils equal, round, reactive to light.  Fundoscopic exam unremarkable, no papilledema. Extraocular movements intact with no nystagmus. Visual fields full. Facial sensation intact. No facial asymmetry. Tongue, uvula, palate midline.  Motor: Bulk and tone normal, muscle strength 5/5 throughout with no pronator drift.  Sensation to light touch, temperature and vibration intact.  No extinction to double simultaneous stimulation.  Deep tendon reflexes 2+ throughout, toes downgoing.  Finger to nose testing intact.  Gait narrow-based and steady, able to tandem walk adequately.  Romberg negative.  IMPRESSION: This is a pleasant 52 yo RH woman with a history of post-concussion syndrome after a fall in February 2015, doing well for 2 years off medication, who presented with an episode of vertigo with right arm numbness  last August 2018. Her neurological exam is normal. MRI/MRA brain normal. Symptoms likely related to BPPV with possible associated anxiety, less likely TIA. No further recurrence. We discussed control of vascular risk factors. She knows to go to the ER for any sudden change in symptoms. She will follow-up as needed and knows to call for any changes.   Thank you for allowing me to participate in her care.  Please do not hesitate to call for any questions or concerns.  The duration of this appointment visit was 15 minutes of face-to-face time with the patient.  Greater than 50% of this time was spent in counseling, explanation of diagnosis, planning of further management, and coordination of care.   Ellouise Newer, M.D.   CC: Dr. Damita Dunnings

## 2017-09-04 ENCOUNTER — Encounter: Payer: Self-pay | Admitting: Neurology

## 2017-11-27 ENCOUNTER — Encounter: Payer: Self-pay | Admitting: Family Medicine

## 2017-11-27 ENCOUNTER — Ambulatory Visit: Payer: BC Managed Care – PPO | Admitting: Family Medicine

## 2017-11-27 ENCOUNTER — Ambulatory Visit: Payer: Self-pay

## 2017-11-27 VITALS — BP 122/78 | HR 59 | Temp 98.2°F | Ht 62.5 in | Wt 219.5 lb

## 2017-11-27 DIAGNOSIS — W57XXXA Bitten or stung by nonvenomous insect and other nonvenomous arthropods, initial encounter: Secondary | ICD-10-CM

## 2017-11-27 DIAGNOSIS — S50861A Insect bite (nonvenomous) of right forearm, initial encounter: Secondary | ICD-10-CM | POA: Diagnosis not present

## 2017-11-27 MED ORDER — TRIAMCINOLONE ACETONIDE 0.1 % EX CREA: 1.0000 "application " | TOPICAL_CREAM | Freq: Two times a day (BID) | CUTANEOUS | 0 refills | Status: AC

## 2017-11-27 NOTE — Assessment & Plan Note (Signed)
Reassured - anticipate local reaction after insect bite. Not consistent with necrotizing spider bite or cellulitis. Supportive care as per instructions, update if not improving with treatment.

## 2017-11-27 NOTE — Progress Notes (Signed)
BP 122/78 (BP Location: Left Arm, Patient Position: Sitting, Cuff Size: Large)   Pulse (!) 59   Temp 98.2 F (36.8 C) (Oral)   Ht 5' 2.5" (1.588 m)   Wt 219 lb 8 oz (99.6 kg)   LMP 08/02/2017   SpO2 98%   BMI 39.51 kg/m    CC: check ?insect bite Subjective:    Patient ID: Judy Anderson, female    DOB: 1966-01-26, 52 y.o.   MRN: [id number]  HPI: Judy Anderson is a 52 y.o. female presenting on 11/27/2017 for Insect Bite (Noticed red area on 11/25/17, spreading.  Located on anterior right lower UE. Area is itchy. Not sure what bit her.)   Bug bite on Monday, very itchy and enlarging. Not tender. Redness around bite. 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 Didn't see what bit her. Isolated to R forearm.   Has tried nothing for this yet.  Wanted to make sure it was not a spider bite.  No prior reaction to bug bites.   Relevant past medical, surgical, family and social history reviewed and updated as indicated. Interim medical history since our last visit reviewed. Allergies and medications reviewed and updated. Outpatient Medications Prior to Visit  Medication Sig Dispense Refill  . cholecalciferol (VITAMIN D) 1000 units tablet Take 1 tablet (1,000 Units total) by mouth daily.    Marland Kitchen diltiazem (CARDIZEM) 30 MG tablet Take 1 tablet (30 mg total) by mouth 3 (three) times daily as needed. 30 tablet 11  . docusate sodium (COLACE) 250 MG capsule Take 1 capsule (250 mg total) daily by mouth. 10 capsule 0  . hydrochlorothiazide (HYDRODIURIL) 25 MG tablet Take 0.5-1 tablets (12.5-25 mg total) by mouth daily as needed (swelling). 30 tablet 1  . meclizine (ANTIVERT) 25 MG tablet Take 1 tablet (25 mg total) by mouth 3 (three) times daily as needed for dizziness. 15 tablet 0  . ondansetron (ZOFRAN) 4 MG tablet Take 1 tablet (4 mg total) by mouth 2 (two) times daily as needed for nausea or vomiting. 12 tablet 0  . phenylephrine-shark liver oil-mineral oil-petrolatum (PREPARATION H) 0.25-3-14-71.9 % rectal  ointment Place 1 application 2 (two) times daily as needed rectally for hemorrhoids. 30 g 0  . witch hazel-glycerin (TUCKS) pad Apply 1 application as needed topically for itching. 40 each 12  . cyclobenzaprine (FLEXERIL) 10 MG tablet Take 1 tablet (10 mg total) by mouth at bedtime as needed for muscle spasms. 15 tablet 0   No facility-administered medications prior to visit.      Per HPI unless specifically indicated in ROS section below Review of Systems     Objective:    BP 122/78 (BP Location: Left Arm, Patient Position: Sitting, Cuff Size: Large)   Pulse (!) 59   Temp 98.2 F (36.8 C) (Oral)   Ht 5' 2.5" (1.588 m)   Wt 219 lb 8 oz (99.6 kg)   LMP 08/02/2017   SpO2 98%   BMI 39.51 kg/m   Wt Readings from Last 3 Encounters:  11/27/17 219 lb 8 oz (99.6 kg)  08/20/17 205 lb (93 kg)  04/29/17 202 lb (91.6 kg)    Physical Exam  Constitutional: She appears well-developed and well-nourished. No distress.  Skin: Skin is warm. There is erythema.  R forearm with bug bite with surrounding induration and mild erythema 2cm circumference around bite, very itchy  Nursing note and vitals reviewed.     Assessment & Plan:   Problem List Items Addressed This Visit  Insect bite of forearm with local reaction, right, initial encounter - Primary    Reassured - anticipate local reaction after insect bite. Not consistent with necrotizing spider bite or cellulitis. Supportive care as per instructions, update if not improving with treatment.           Meds ordered this encounter  Medications  . triamcinolone cream (KENALOG) 0.1 %    Sig: Apply 1 application topically 2 (two) times daily. Apply to AA.    Dispense:  30 g    Refill:  0   No orders of the defined types were placed in this encounter.   Follow up plan: Return if symptoms worsen or fail to improve.  Ria Bush, MD

## 2017-11-27 NOTE — Patient Instructions (Signed)
You had a local reaction after bug bite - treat with ice pack for next 1-2 days then transition to warm compresses.  Also use triamcinolone steroid cream sent to pharmacy. May do twice daily for the next week.   Insect Bite, Adult An insect bite can make your skin red, itchy, and swollen. An insect bite is different from an insect sting, which happens when an insect injects poison (venom) into the skin. Some insects can spread disease to people through a bite. However, most insect bites do not lead to disease and are not serious. What are the causes? Insects may bite for a variety of reasons, including:  Hunger.  To defend themselves.  Insects that bite include:  Spiders.  Mosquitoes.  Ticks.  Fleas.  Ants.  Flies.  Bedbugs.  What are the signs or symptoms? Symptoms of this condition include:  Itching or pain in the bite area.  Redness and swelling in the bite area.  An open wound (skin ulcer).  In many cases, symptoms last for 2-4 days. How is this diagnosed? This condition is usually diagnosed based on symptoms and a physical exam. How is this treated? Treatment is usually not needed. Symptoms often go away on their own. When treatment is recommended, it may involve:  Applying a cream or lotion to the bitten area. This treatment helps with itching.  Taking an antibiotic medicine. This treatment is needed if the bite area gets infected.  Getting a tetanus shot.  Applying ice to the affected area.  Medicines called antihistamines. This treatment is needed if you develop an allergic reaction to the insect bite.  Follow these instructions at home: Bite area care  Do not scratch the bite area.  Keep the bite area clean and dry. Wash it every day with soap and water as told by your health care provider.  Check the bite area every day for signs of infection. Check for: ? More redness, swelling, or pain. ? Fluid or blood. ? Warmth. ? Pus. Managing pain,  itching, and swelling   You may apply a baking soda paste, cortisone cream, or calamine lotion to the bite area as told by your health care provider.  If directed, applyice to the bite area. ? Put ice in a plastic bag. ? Place a towel between your skin and the bag. ? Leave the ice on for 20 minutes, 2-3 times per day. Medicines  Apply or take over-the-counter and prescription medicines only as told by your health care provider.  If you were prescribed an antibiotic medicine, use it as told by your health care provider. Do not stop using the antibiotic even if your condition improves. General instructions  Keep all follow-up visits as told by your health care provider. This is important. How is this prevented? To help reduce your risk of insect bites:  When you are outdoors, wear clothing that covers your arms and legs.  Use insect repellent. The best insect repellents contain: ? DEET, picaridin, oil of lemon eucalyptus (OLE), or IR3535. ? Higher amounts of an active ingredient.  If your home windows do not have screens, consider installing them.  Contact a health care provider if:  You have more redness, swelling, or pain in the bite area.  You have fluid, blood, or pus coming from the bite area.  The bite area feels warm to the touch.  You have a fever. Get help right away if:  You have joint pain.  You have a rash.  You have  shortness of breath.  You feel unusually tired or sleepy.  You have neck pain.  You have a headache.  You have unusual weakness.  You have chest pain.  You have nausea, vomiting, or pain in the abdomen. This information is not intended to replace advice given to you by your health care provider. Make sure you discuss any questions you have with your health care provider. Document Released: 07/12/2004 Document Revised: 02/01/2016 Document Reviewed: 12/12/2015 Elsevier Interactive Patient Education  [employer].

## 2017-11-27 NOTE — Telephone Encounter (Signed)
Pt. called to report redness and welt on right forearm.  Stated she noticed it on Monday, and it has increased in size.  Reported "it doesn't look like a mosquito bite, but I don't know what bit me." Stated "the area is red and approx. size is just a little bigger than a nickel, and slightly raised."  Stated "it is very itchy."  Stated there is some warmth, but no pain/ tenderness.  Denied any drainage.  Has not treated with any anti-itch cream, at this point.  Appt. Given for today at 4:15 PM at PCP office.  Care advice given per protocol.  Verb. Understanding; agrees with plan.    Reason for Disposition . [1] Red or very tender (to touch) area AND [2] started over 24 hours after the bite  Answer Assessment - Initial Assessment Questions 1. TYPE of INSECT: "What type of insect was it?"      unsure 2. ONSET: "When did you get bitten?"      Monday 3. LOCATION: "Where is the insect bite located?"     Right forearm 4. REDNESS: "Is the area red or pink?" If so, ask "What size is area of redness?" (inches or cm). "When did the redness start?"     Redness about a little larger than nickel size  5. PAIN: "Is there any pain?" If so, ask: "How bad is it?"  (Scale 1-10; or mild, moderate, severe)     Denied pain/ tenderness  6. ITCHING: "Does it itch?" If so, ask: "How bad is the itch?"    - MILD: doesn't interfere with normal activities   - MODERATE-SEVERE: interferes with work, school, sleep, or other activities      moderate 7. SWELLING: "How big is the swelling?" (inches, cm, or compare to coins)     A little bigger than nickel size ; slightly raised 8. OTHER SYMPTOMS: "Do you have any other symptoms?"  (e.g., difficulty breathing, hives)     Redness has increased in size since Monday  9. PREGNANCY: "Is there any chance you are pregnant?" "When was your last menstrual period?"     LMP February; has periods off and on  Protocols used: INSECT BITE-A-AH

## 2017-11-27 NOTE — Telephone Encounter (Signed)
Pt has appt 11/27/17 at 4:15 with Dr Darnell Level.

## 2017-12-01 ENCOUNTER — Other Ambulatory Visit: Payer: Self-pay | Admitting: Family Medicine

## 2017-12-01 DIAGNOSIS — E538 Deficiency of other specified B group vitamins: Secondary | ICD-10-CM

## 2017-12-01 DIAGNOSIS — R609 Edema, unspecified: Secondary | ICD-10-CM

## 2017-12-02 ENCOUNTER — Other Ambulatory Visit: Payer: Managed Care, Other (non HMO)

## 2017-12-03 ENCOUNTER — Encounter: Payer: Self-pay | Admitting: Family Medicine

## 2017-12-03 ENCOUNTER — Ambulatory Visit (INDEPENDENT_AMBULATORY_CARE_PROVIDER_SITE_OTHER): Payer: BC Managed Care – PPO | Admitting: Family Medicine

## 2017-12-03 VITALS — BP 102/72 | HR 60 | Temp 98.5°F | Ht 60.0 in | Wt 219.8 lb

## 2017-12-03 DIAGNOSIS — M25569 Pain in unspecified knee: Secondary | ICD-10-CM

## 2017-12-03 DIAGNOSIS — E538 Deficiency of other specified B group vitamins: Secondary | ICD-10-CM

## 2017-12-03 DIAGNOSIS — R609 Edema, unspecified: Secondary | ICD-10-CM | POA: Diagnosis not present

## 2017-12-03 DIAGNOSIS — Z7189 Other specified counseling: Secondary | ICD-10-CM

## 2017-12-03 DIAGNOSIS — Z0000 Encounter for general adult medical examination without abnormal findings: Secondary | ICD-10-CM

## 2017-12-03 LAB — BASIC METABOLIC PANEL
BUN: 16 mg/dL (ref 6–23)
CHLORIDE: 104 meq/L (ref 96–112)
CO2: 29 mEq/L (ref 19–32)
Calcium: 9.4 mg/dL (ref 8.4–10.5)
Creatinine, Ser: 0.87 mg/dL (ref 0.40–1.20)
GFR: 87.59 mL/min (ref >60.00)
Glucose, Bld: 112 mg/dL — ABNORMAL HIGH (ref 70–99)
POTASSIUM: 4.6 meq/L (ref 3.5–5.1)
Sodium: 139 mEq/L (ref 135–145)

## 2017-12-03 LAB — VITAMIN B12: VITAMIN B 12: 559 pg/mL (ref 211–911)

## 2017-12-03 NOTE — Patient Instructions (Addendum)
You can call for a mammogram at [facility]  Go to the lab on the way out.  We'll contact you with your lab report. Take care.  Glad to see you.  Don't change your meds for now.  Update me as needed.

## 2017-12-03 NOTE — Progress Notes (Signed)
CPE- See plan.  Routine anticipatory guidance given to patient.  See health maintenance.  The possibility exists that previously documented standard health maintenance information may have been brought forward from a previous encounter into this note.  If needed, that same information has been updated to reflect the current situation based on today's encounter.    Tetanus 2010  Flu prev done, EMR prev updated  PNA not due  Shingles not due  Mammogram due, d/w pt.   See avs.   Pap per Dr. Kennon Rounds.  I'll defer.   DXA not due. D/w pt.  Colonoscopy 2010  Living will d/w pt. Would have her cousin Merry Proud (and his wife Karena Addison) and patient's pastor Dr. Lennette Bihari A. Jimmye Norman all equally designated if patient were incapacitated.  Diet and exercise d/w pt.  "I'm doing better."  I am more concerned about "the process" with diet and exercise instead of the specific number with her weight.  She is exercising 3 times per week at the gym.  She is drinking more water.  We talked about using an app for calorie tracking/etc.   HIV prev neg 2011.   Off B12 replacement.  See notes on labs.    No use of HCTZ or CCB recently.  D/w pt.    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    She has been exercising on the treadmill and is noted some left lateral knee pain with exercise.  She does not have patellar or medial joint pain.  No trauma otherwise.  She has not been doing much side to side exercises to engage her lateral thigh muscles.  PMH and SH reviewed Meds, vitals, and allergies reviewed.   ROS: Per HPI.  Unless specifically indicated otherwise in HPI, the patient denies:  General: fever. Eyes: acute vision changes ENT: sore throat Cardiovascular: chest pain Respiratory: SOB GI: vomiting GU: dysuria Musculoskeletal: acute back pain Derm: acute rash Neuro: acute motor dysfunction Psych: worsening mood Endocrine: polydipsia Heme: bleeding Allergy: hayfever  GEN:  nad, alert and oriented HEENT: mucous membranes moist NECK: supple w/o LA CV: rrr. PULM: ctab, no inc wob ABD: soft, +bs EXT: no edema SKIN: no acute rash L knee not ttp with normal ROM

## 2017-12-04 DIAGNOSIS — M25569 Pain in unspecified knee: Secondary | ICD-10-CM | POA: Insufficient documentation

## 2017-12-04 NOTE — Assessment & Plan Note (Signed)
See notes on labs. 

## 2017-12-04 NOTE — Assessment & Plan Note (Signed)
Likely with relative ITB weakness.  Discussed with patient about side leg lifts and follow-up as needed.  She agrees.

## 2017-12-04 NOTE — Assessment & Plan Note (Signed)
Tetanus 2010  Flu prev done, EMR prev updated  PNA not due  Shingles not due  Mammogram due, d/w pt.   See avs.   Pap per Dr. Kennon Rounds.  I'll defer.   DXA not due. D/w pt.  Colonoscopy 2010  Living will d/w pt. Would have her cousin Merry Proud (and his wife Karena Addison) and patient's pastor Dr. Lennette Bihari A. Jimmye Norman all equally designated if patient were incapacitated.  Diet and exercise d/w pt.  "I'm doing better."  I am more concerned about "the process" with diet and exercise instead of the specific number with her weight.  She is exercising 3 times per week at the gym.  She is drinking more water.  We talked about using an app for calorie tracking/etc.   HIV prev neg 2011.

## 2017-12-04 NOTE — Assessment & Plan Note (Signed)
Continue work on diet and exercise.  Fortunately she has not had to use hydrochlorothiazide recently for edema.  Also noted that she has not had to use any calcium channel blocker doses recently.

## 2017-12-04 NOTE — Assessment & Plan Note (Signed)
Living will d/w pt. Would have her cousin Merry Proud (and his wife Karena Addison) and patient's pastor Dr. Lennette Bihari A. Jimmye Norman all equally designated if patient were incapacitated.

## 2017-12-09 ENCOUNTER — Other Ambulatory Visit: Payer: Self-pay | Admitting: Family Medicine

## 2017-12-09 DIAGNOSIS — Z1231 Encounter for screening mammogram for malignant neoplasm of breast: Secondary | ICD-10-CM

## 2017-12-24 ENCOUNTER — Ambulatory Visit
Admission: RE | Admit: 2017-12-24 | Discharge: 2017-12-24 | Disposition: A | Discharge status: Home / Self Care | Payer: BC Managed Care – PPO | Source: Ambulatory Visit | Attending: Family Medicine | Admitting: Family Medicine

## 2017-12-24 ENCOUNTER — Encounter: Payer: Self-pay | Admitting: *Deleted

## 2017-12-24 DIAGNOSIS — Z1231 Encounter for screening mammogram for malignant neoplasm of breast: Secondary | ICD-10-CM | POA: Insufficient documentation

## 2018-10-08 ENCOUNTER — Telehealth: Payer: Self-pay

## 2018-10-08 NOTE — Telephone Encounter (Signed)
Called patient from Recall list.  No answer. LMOV.  Need to schedule an Evisit with patient.

## 2018-10-29 NOTE — Telephone Encounter (Signed)
Patient does not have insurance  Would like to wait on scheduling

## 2019-02-13 ENCOUNTER — Encounter: Payer: Self-pay | Admitting: Family Medicine

## 2019-02-13 NOTE — Telephone Encounter (Signed)
Pt called back at 4:56 pm; pt said rt leg is very swollen and bruise at ankle and rt knee is hurting. Rt leg hurts worse at ankle and heel when bears wt. Pt has had leg elevated and taking ibuprofen for pain which helps some. Pt is going to [facility] ED and will cb next wk with update. FYI to Dr Damita Dunnings.

## 2019-02-15 ENCOUNTER — Telehealth: Payer: Self-pay | Admitting: Family Medicine

## 2019-02-15 NOTE — Telephone Encounter (Signed)
Please get update on patient.  Thanks.  See mychart message about outside ER eval.

## 2019-02-16 ENCOUNTER — Encounter: Payer: Self-pay | Admitting: Family Medicine

## 2019-02-16 NOTE — Telephone Encounter (Signed)
Attempted to contact pt, however, the only number on file has calling restrictions and will not complete the call.  Will send MyChart message asking pt for an update.

## 2019-02-17 NOTE — Telephone Encounter (Signed)
See other mychart message from patient. I sent over to try and get these records but advised patient this may not work unless we have her signature on ROI form. Will await reply from [facility]. FYI to PCP

## 2019-02-18 NOTE — Telephone Encounter (Signed)
Noted. Thanks.

## 2019-02-25 NOTE — Telephone Encounter (Signed)
I have never seen this patient

## 2019-02-25 NOTE — Telephone Encounter (Signed)
See 02-13-19 pt message; pt was seen at [facility] [place] on 02/13/19. Pt is still having rt knee pain and swelling; knee hurts worse when bends knee and wt bearing; now pain level is 5. Pt also has swelling in lower rt leg and foot. Pt said the lt foot is also swollen and pt is not sure why lt foot is swollen because stepped in hole originally with rt foot. Pt has being using ice and elevating legs and feet. Pt has been taking Naproxen which has helped some. Pt wearing a compression knee support to stabilize the knee. Pt scheduled in office appt with Dr Lorelei Pont on 02/26/19 at 12 noon. Pt has no covid symptoms, no travel and no known exposure to + covid. Pt called back and spoke with Loma Sousa and pt is going to have to get a way to the appt and pt may have to see a provider closer to her home in [place].pt said things are not the same at [facility] as when she first started coming; she likes the people here but she is not sure we are as caring as we used to be; then pt said it is not anything about not caring but it is just a feeling.  pt will call as soon as she can find out if someone will bring her to [facility]. I told pt I understand her situation but to call as soon as she knows if she is going to keep the appt with Dr Lorelei Pont or not on 02/26/19 so pt would not be charged a $ 50.00 no show fee. Pt voiced understanding and will cb ASAP if needs to cancel appt. FYI to Dr Damita Dunnings and Dr Lorelei Pont. And Butch Penny CMA.

## 2019-02-25 NOTE — Telephone Encounter (Signed)
I do not have much to add in this situation.  I have always been glad to see this patient.  If there is a specific complaint I can address then please let me know.  Thanks.

## 2019-02-26 ENCOUNTER — Ambulatory Visit: Payer: BC Managed Care – PPO | Admitting: Family Medicine

## 2019-02-26 NOTE — Telephone Encounter (Signed)
Dr Lorelei Pont was on phone and Butch Penny CMA said if Dr Lorelei Pont wanted appt changed to different provider he would have said so; thinks he was addressing the possible dissatisfaction of pt and that he had never seen the pt. Left appt as is for today at 12 noon. FYI to [employer].
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
## 2019-02-26 NOTE — Telephone Encounter (Signed)
[facility] Night - Client Nonclinical Telephone Record AccessNurse Client [facility] Night - Client Client Site [place] Physician Owens Loffler - MD Contact Type Call Who Is Calling Patient / Member / Family / Caregiver Caller Name Lassie Petrovski Caller Phone Number [phone] Patient Name Judy Anderson Patient DOB December 21, 1965 Call Type Message Only Information Provided Reason for Call Request to Reschedule Office Appointment Initial Comment Caller states that she had an appt scheduled, but her transportation can't bring her tomorrow 12pm. Caller worried about 50$ fee. Additional Comment Caller declined triage. Provided office hours. Call Closed By: Ezequiel Kayser Transaction Date/Time: 02/25/2019 5:11:25 PM (ET)
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
## 2019-03-08 ENCOUNTER — Telehealth: Payer: Self-pay | Admitting: Family Medicine

## 2019-03-08 NOTE — Telephone Encounter (Signed)
I did not dismiss her concerns.  We have good documented evidence that we talked about a rational approach to weight loss via diet and exercise. I did not withhold any information. I make detailed notes at clinic for reference.  I do not address anonymous Internet comments. If she does not think we provide good care than she should find a different clinic. If she does not want to voluntarily withdraw from the clinic then I think it makes sense to dismiss her given her lack of satisfaction/trust with the care that she was given here. I wish her the best.

## 2019-03-13 NOTE — Telephone Encounter (Addendum)
Attempted to reach patient to notify her of her dismissal from our practice and all [facility] given her lack of trust in her provider and our practice in general as reported in our previous phone encounters. (please refer to the phone conversation on 9/10 and 9/17, documented in the 02/16/19 my chart message encounter). Dr. Carole Civil response to those concerns is listed below in this phone encounter.   LM on voicemail for patient to call back for final discussion and closure to the situation.   Dismissal letter has been mailed today to patient.

## 2019-03-15 NOTE — Telephone Encounter (Signed)
Noted. Thanks.

## 2019-03-18 NOTE — Telephone Encounter (Signed)
Pt left v/m that she was returning Judy Anderson's call and wants to know what time her appt is on 03/23/19.

## 2019-03-19 NOTE — Telephone Encounter (Addendum)
Unable to reach patient again but did leave more details on her voicemail regarding her dismissal and that her appointment for Monday would be cancelled for her annual physical care.  We can see her, if she chooses, for urgent needs over the next 30 days and fill her meds (if needed)  But she will need to set up with a new provider outside of [facility] practices for continued care beyond the 30 day window. (per our dismissal policy).     I, apologized, for leaving this on her voicemail (okay per her DPR) and would prefer over the phone conversation regarding this but I have been unable to reach her X 2 now and her prior scheduled appt is Monday and did not want her to show up unnecessarily.    I asked her to please feel free to call me back and discuss further if needed.

## 2019-03-19 NOTE — Telephone Encounter (Signed)
Patient returned my call.  She was calm and pleasant as I explained details around her dismissal.  She is aware that physical has been cancelled for Monday and that she may access [place] (if she chooses) for urgent medical care and/or r/x refill needs (if appropriate) over the next 30 days but at that time she will need to be transitioned to another health care provider outside of all [place] practices.    I apologized for her perceived experience and wished her all of the best.  Patient will let us know where she wants her records sent to once she has changed providers.   Patient verbalizes agreement and has no further questions.

## 2019-03-20 NOTE — Telephone Encounter (Signed)
Noted. Thanks.

## 2019-03-23 ENCOUNTER — Encounter: Payer: BC Managed Care – PPO | Admitting: Family Medicine

## 2019-06-16 IMAGING — MR MR MRA HEAD W/O CM
11 series · 41 of 48 positions shown · non-contrast
Comparison: Brain and cervical spine MRI 09/16/2013

CLINICAL DATA: 51-year-old female with vertigo, symptoms of the
room spinning for 1 month. Right arm numbness. Concussion in 1896.

EXAM:
MRI HEAD WITHOUT CONTRAST
MRA HEAD WITHOUT CONTRAST
TECHNIQUE: Multiplanar, multiecho pulse sequences of the brain and surrounding
structures were obtained without intravenous contrast. Angiographic
images of the head were obtained using MRA technique without
contrast.

[Series 2: T1 · sagittal · 5.0mm · 0.45mm/px · 1 of 21 slices shown]
[im 1/21]
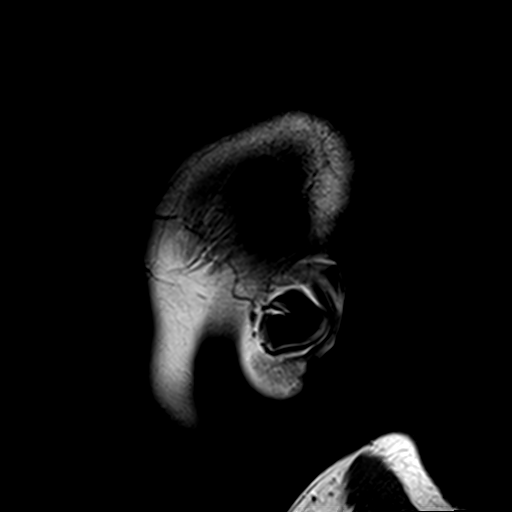

[Series 5: tof_3d_multi-slab · axial · 0.7mm · 0.37mm/px · z∈[-34,+80]mm · 8 of 156 slices shown]
[im 1/156]
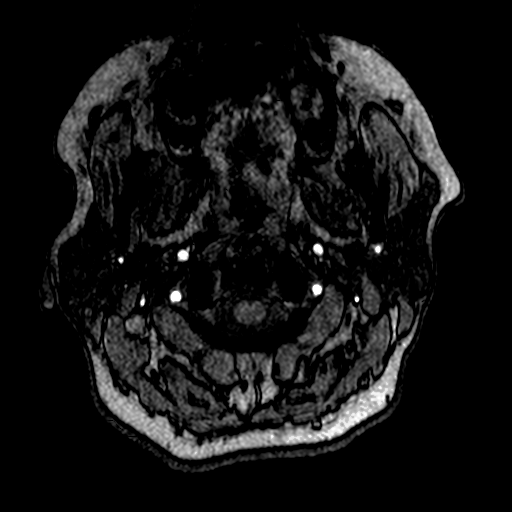
[im 32/156]
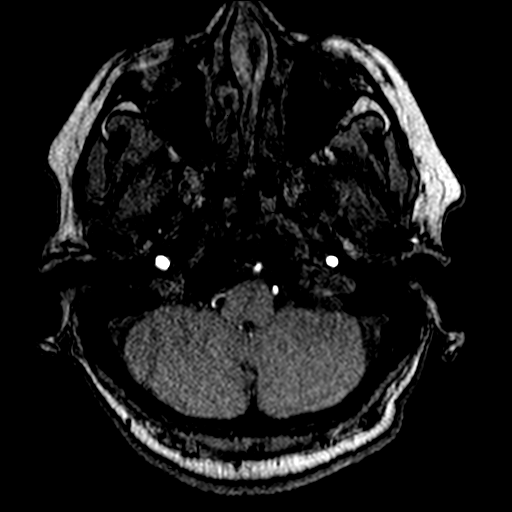
[im 47/156]
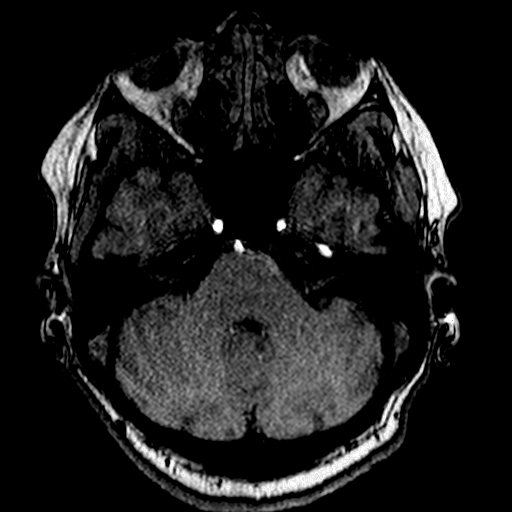
[im 63/156]
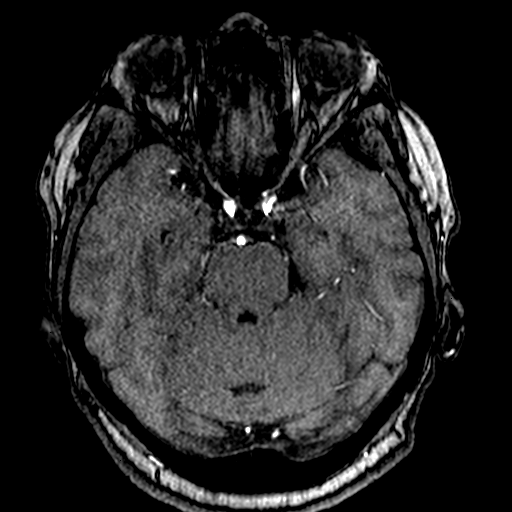
[im 94/156]
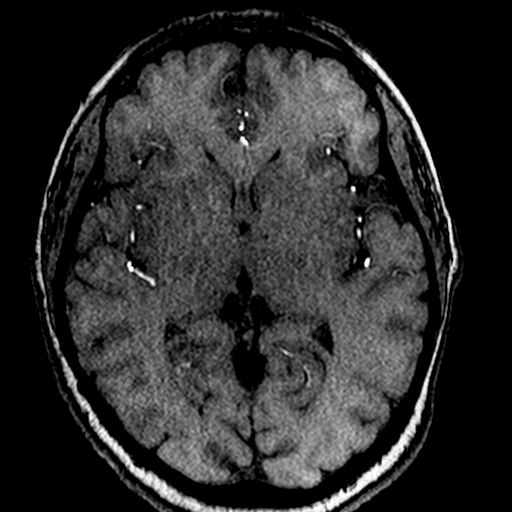
[im 109/156]
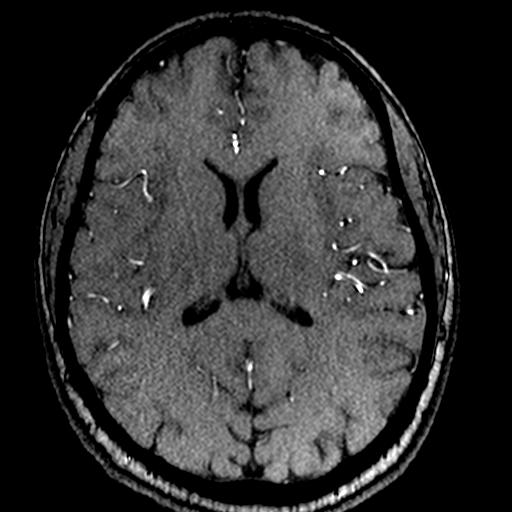
[im 125/156]
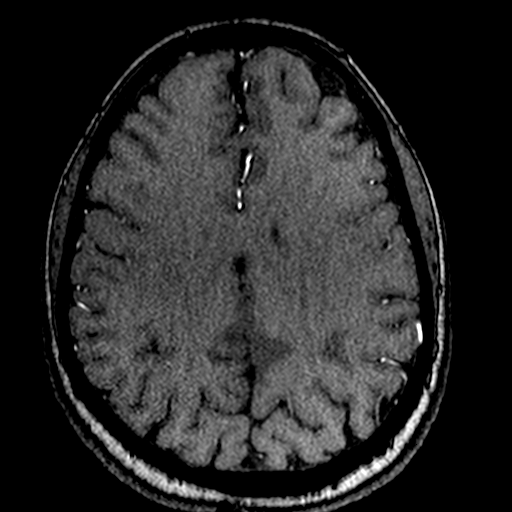
[im 156/156]
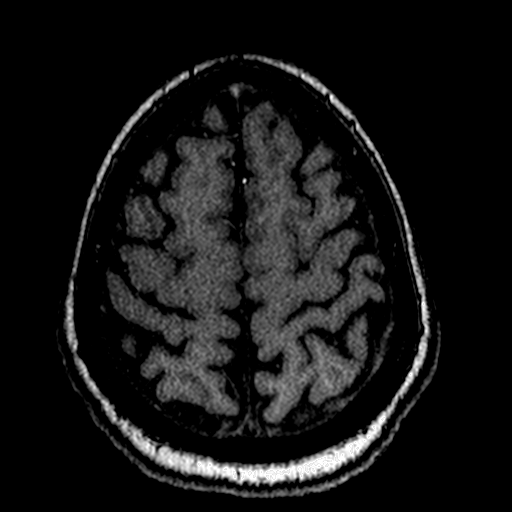

[Series 9: DWI · axial · 3.0mm · 1.80mm/px · z∈[-38,+108]mm · 7 of 99 slices shown (1 of 4)]
[im 1/99]
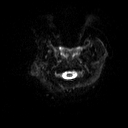
[im 17/99]
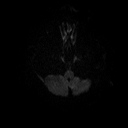
[im 33/99]
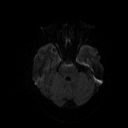
[im 50/99]
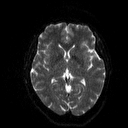
[im 66/99]
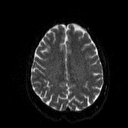
[im 82/99]
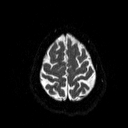
[im 99/99]
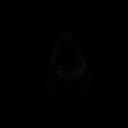

[Series 10: DWI · axial · 3.0mm · 1.80mm/px · z∈[-38,+108]mm · 3 of 48 slices shown (2 of 4)]
[im 1/48]
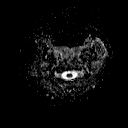
[im 24/48]
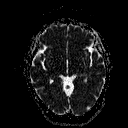
[im 48/48]
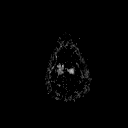

[Series 11: DWI · coronal · 5.0mm · 1.80mm/px · 5 of 69 slices shown (3 of 4)]
[im 1/69]
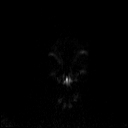
[im 18/69]
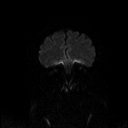
[im 35/69]
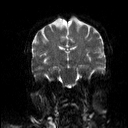
[im 52/69]
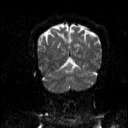
[im 69/69]
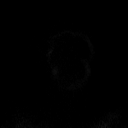

[Series 12: DWI · coronal · 5.0mm · 1.80mm/px · 3 of 36 slices shown (4 of 4)]
[im 1/36]
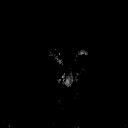
[im 18/36]
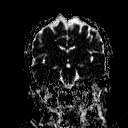
[im 36/36]
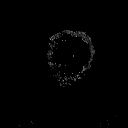

[Series 17: FLAIR · axial · 3.0mm · 0.45mm/px · z∈[-42,+114]mm · 2 of 35 slices shown]
[im 1/35]
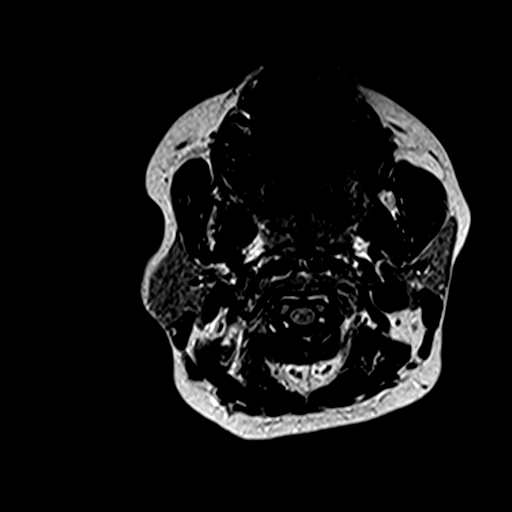
[im 35/35]
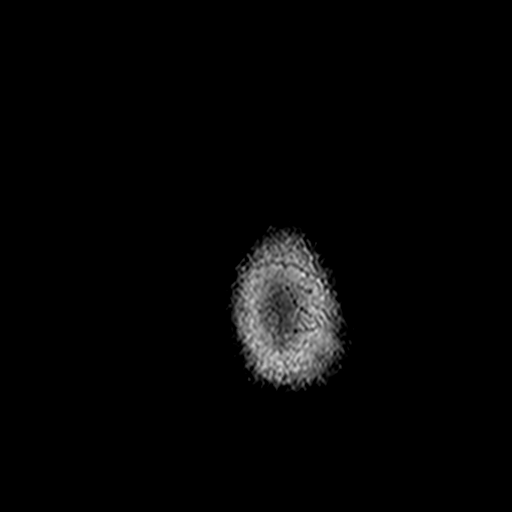

[Series 23: swi_images · axial · 5.0mm · 0.90mm/px · z∈[-43,+101]mm · 2 of 30 slices shown]
[im 1/30]
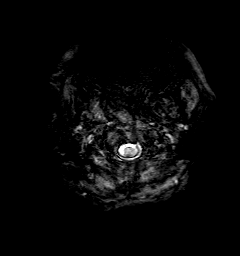
[im 30/30]
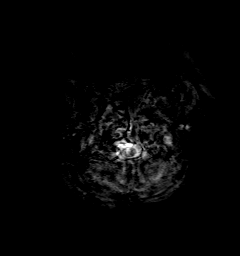

[Series 25: T2 · coronal · 5.0mm · 0.45mm/px · 2 of 29 slices shown (1 of 2)]
[im 1/29]
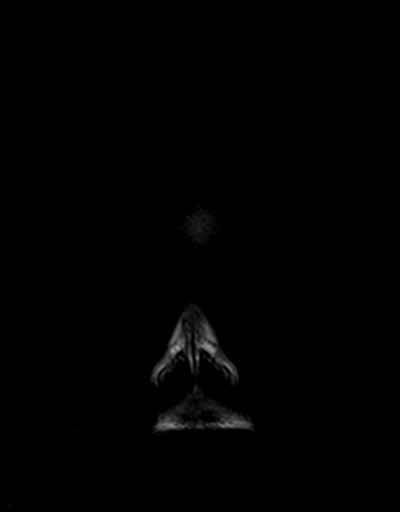
[im 29/29]
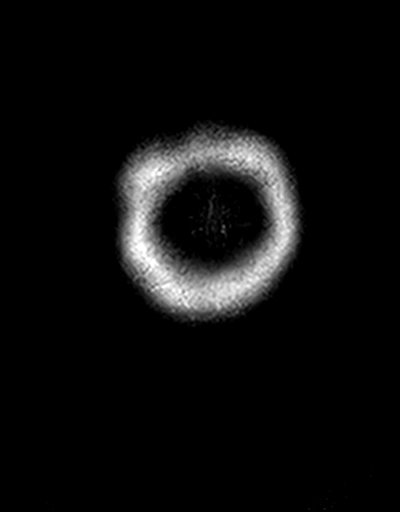

[Series 26: T2 · axial · 5.0mm · 0.51mm/px · z∈[-45,+101]mm · 2 of 22 slices shown (2 of 2)]
[im 1/22]
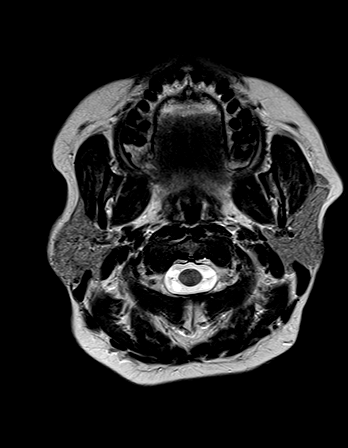
[im 22/22]
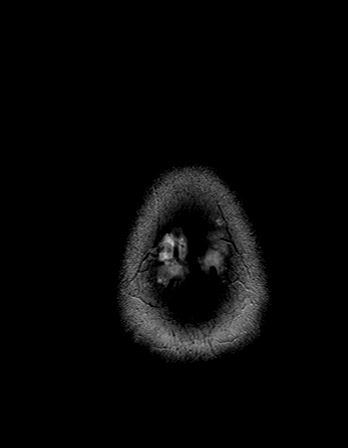

[Series 27: t1_mpr_tra · axial · 1.0mm · 0.71mm/px · z∈[-45,+48]mm · 6 of 144 slices shown]
[im 1/144]
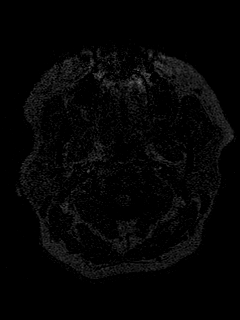
[im 16/144]
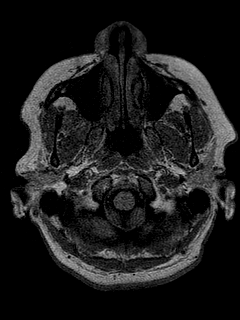
[im 48/144]
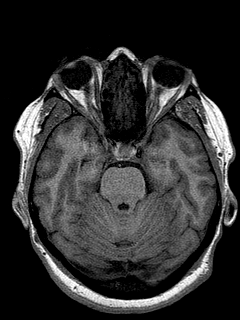
[im 64/144]
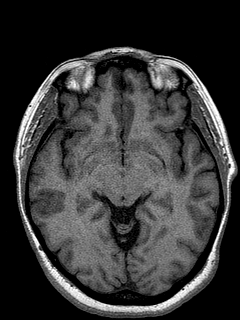
[im 80/144]
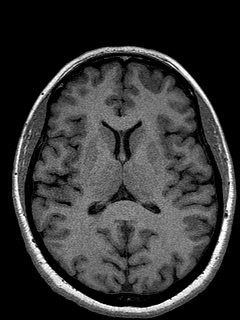
[im 96/144]
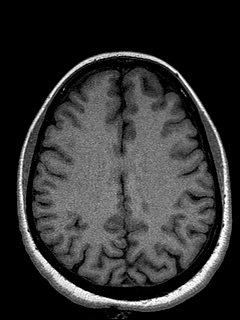

[41 of 48 positions shown; findings below may reference images not displayed]

FINDINGS: MRI HEAD FINDINGS

Brain: No dental braces related susceptibility today. Cerebral
volume remains normal. No restricted diffusion to suggest acute
infarction. No midline shift, mass effect, evidence of mass lesion,
ventriculomegaly, extra-axial collection or acute intracranial
hemorrhage. Cervicomedullary junction and pituitary are within
normal limits.

Stable an normal for age gray and white matter signal; minimal small
subcortical white matter foci of nonspecific T2 and FLAIR
hyperintensity (left frontal lobe series 17, image 22). No cortical
encephalomalacia. No chronic cerebral blood products identified.
Deep gray matter nuclei, brainstem, and cerebellum remain normal.

Vascular: Major intracranial vascular flow voids are stable.

Skull and upper cervical spine: C5-C6 disc bulging again noted.
Visualized bone marrow signal is within normal limits.

Sinuses/Orbits: Orbits soft tissues are within normal limits. Mild
to moderate bilateral paranasal sinus mucosal thickening today,
maximal in the ethmoids. The frontal sinuses are hypoplastic and
relatively spared. No sinus fluid levels.

Other: Mastoid air cells are clear. Visible internal auditory
structures appear normal. Scalp and face soft tissues appear
negative.

MRA HEAD FINDINGS

Antegrade flow in the posterior circulation with codominant distal
vertebral arteries. Patent PICA origins. Patent vertebrobasilar
junction. Patent SCA and normal PCA origins. Posterior communicating
arteries are diminutive or absent. Bilateral PCA branches are within
normal limits.

Antegrade flow in both ICA siphons. No siphon stenosis. Normal
ophthalmic artery origins. Mildly dominant appearing in tortuous
right ICA siphon and right ICA terminus. The right ACA A1 segment is
dominant. Anterior communicating artery and visible ACA branches are
within normal limits. Normal MCA origins. Bilateral MCA M1 segments,
MCA bifurcations, and visible MCA branches are normal.
IMPRESSION: 1. No acute intracranial abnormality. Stable and normal for age
noncontrast MRI appearance of the brain.
2.  Negative intracranial MRA.
